# Patient Record
Sex: Female | Born: 1970 | Race: White | Hispanic: Yes | Marital: Married | Smoking: Never smoker
Health system: Southern US, Community
[De-identification: ages and names within clinical notes are randomized; demographics above are authoritative.]

## PROBLEM LIST (undated history)

## (undated) DIAGNOSIS — K2970 Gastritis, unspecified, without bleeding: Secondary | ICD-10-CM

## (undated) DIAGNOSIS — M1990 Unspecified osteoarthritis, unspecified site: Secondary | ICD-10-CM

## (undated) DIAGNOSIS — J45909 Unspecified asthma, uncomplicated: Secondary | ICD-10-CM

## (undated) DIAGNOSIS — B009 Herpesviral infection, unspecified: Principal | ICD-10-CM

## (undated) DIAGNOSIS — E785 Hyperlipidemia, unspecified: Secondary | ICD-10-CM

## (undated) DIAGNOSIS — K219 Gastro-esophageal reflux disease without esophagitis: Secondary | ICD-10-CM

## (undated) DIAGNOSIS — N189 Chronic kidney disease, unspecified: Secondary | ICD-10-CM

## (undated) DIAGNOSIS — K317 Polyp of stomach and duodenum: Secondary | ICD-10-CM

## (undated) HISTORY — DX: Hyperlipidemia, unspecified: E78.5

## (undated) HISTORY — DX: Gastritis, unspecified, without bleeding: K29.70

## (undated) HISTORY — PX: APPENDECTOMY: SHX54

## (undated) HISTORY — DX: Unspecified asthma, uncomplicated: J45.909

## (undated) HISTORY — DX: Chronic kidney disease, unspecified: N18.9

## (undated) HISTORY — DX: Polyp of stomach and duodenum: K31.7

## (undated) HISTORY — DX: Herpesviral infection, unspecified: B00.9

## (undated) HISTORY — PX: OPEN REDUCTION MALAR FRACTURE: SHX2104

## (undated) HISTORY — PX: ESOPHAGOGASTRODUODENOSCOPY: SHX1529

---

## 1999-09-17 ENCOUNTER — Encounter: Admission: RE | Admit: 1999-09-17 | Discharge: 1999-09-17 | Payer: Self-pay | Admitting: Obstetrics & Gynecology

## 1999-09-18 ENCOUNTER — Encounter: Admission: RE | Admit: 1999-09-18 | Discharge: 1999-09-18 | Payer: Self-pay | Admitting: Obstetrics & Gynecology

## 1999-09-24 ENCOUNTER — Ambulatory Visit (HOSPITAL_COMMUNITY): Admission: RE | Admit: 1999-09-24 | Discharge: 1999-09-24 | Payer: Self-pay | Admitting: Obstetrics & Gynecology

## 1999-09-26 ENCOUNTER — Encounter: Payer: Self-pay | Admitting: Obstetrics & Gynecology

## 1999-09-26 ENCOUNTER — Ambulatory Visit (HOSPITAL_COMMUNITY): Admission: RE | Admit: 1999-09-26 | Discharge: 1999-09-26 | Payer: Self-pay | Admitting: Obstetrics & Gynecology

## 1999-10-27 ENCOUNTER — Encounter: Payer: Self-pay | Admitting: Emergency Medicine

## 1999-10-27 ENCOUNTER — Emergency Department (HOSPITAL_COMMUNITY): Admission: EM | Admit: 1999-10-27 | Discharge: 1999-10-27 | Payer: Self-pay | Admitting: Emergency Medicine

## 1999-11-01 ENCOUNTER — Inpatient Hospital Stay (HOSPITAL_COMMUNITY): Admission: AD | Admit: 1999-11-01 | Discharge: 1999-11-01 | Payer: Self-pay | Admitting: *Deleted

## 1999-11-07 ENCOUNTER — Inpatient Hospital Stay (HOSPITAL_COMMUNITY): Admission: AD | Admit: 1999-11-07 | Discharge: 1999-11-07 | Payer: Self-pay | Admitting: Obstetrics & Gynecology

## 1999-12-26 ENCOUNTER — Encounter: Admission: RE | Admit: 1999-12-26 | Discharge: 1999-12-26 | Payer: Self-pay | Admitting: Obstetrics

## 2000-01-15 ENCOUNTER — Ambulatory Visit (HOSPITAL_COMMUNITY): Admission: RE | Admit: 2000-01-15 | Discharge: 2000-01-15 | Payer: Self-pay | Admitting: *Deleted

## 2000-04-29 ENCOUNTER — Inpatient Hospital Stay (HOSPITAL_COMMUNITY): Admission: AD | Admit: 2000-04-29 | Discharge: 2000-04-29 | Payer: Self-pay | Admitting: Obstetrics

## 2000-06-03 ENCOUNTER — Inpatient Hospital Stay (HOSPITAL_COMMUNITY): Admission: AD | Admit: 2000-06-03 | Discharge: 2000-06-03 | Payer: Self-pay | Admitting: Obstetrics

## 2000-06-13 ENCOUNTER — Inpatient Hospital Stay (HOSPITAL_COMMUNITY): Admission: AD | Admit: 2000-06-13 | Discharge: 2000-06-16 | Payer: Self-pay | Admitting: *Deleted

## 2000-06-13 ENCOUNTER — Encounter (INDEPENDENT_AMBULATORY_CARE_PROVIDER_SITE_OTHER): Payer: Self-pay | Admitting: Specialist

## 2000-07-08 ENCOUNTER — Encounter: Admission: RE | Admit: 2000-07-08 | Discharge: 2000-07-08 | Payer: Self-pay | Admitting: Hematology and Oncology

## 2000-08-07 ENCOUNTER — Encounter: Admission: RE | Admit: 2000-08-07 | Discharge: 2000-08-07 | Payer: Self-pay | Admitting: Hematology and Oncology

## 2000-09-09 ENCOUNTER — Inpatient Hospital Stay (HOSPITAL_COMMUNITY): Admission: EM | Admit: 2000-09-09 | Discharge: 2000-09-11 | Payer: Self-pay | Admitting: Emergency Medicine

## 2000-09-09 ENCOUNTER — Encounter: Payer: Self-pay | Admitting: Internal Medicine

## 2000-09-09 ENCOUNTER — Ambulatory Visit (HOSPITAL_COMMUNITY): Admission: RE | Admit: 2000-09-09 | Discharge: 2000-09-09 | Payer: Self-pay | Admitting: Internal Medicine

## 2000-09-09 ENCOUNTER — Encounter: Admission: RE | Admit: 2000-09-09 | Discharge: 2000-09-09 | Payer: Self-pay | Admitting: Internal Medicine

## 2000-09-10 ENCOUNTER — Encounter: Payer: Self-pay | Admitting: Internal Medicine

## 2000-09-11 ENCOUNTER — Encounter: Payer: Self-pay | Admitting: Internal Medicine

## 2000-09-16 ENCOUNTER — Encounter: Admission: RE | Admit: 2000-09-16 | Discharge: 2000-09-16 | Payer: Self-pay | Admitting: Internal Medicine

## 2001-05-07 ENCOUNTER — Ambulatory Visit (HOSPITAL_COMMUNITY): Admission: RE | Admit: 2001-05-07 | Discharge: 2001-05-07 | Payer: Self-pay | Admitting: Family Medicine

## 2001-05-07 ENCOUNTER — Encounter: Payer: Self-pay | Admitting: Family Medicine

## 2001-07-28 ENCOUNTER — Encounter: Payer: Self-pay | Admitting: Family Medicine

## 2001-07-28 ENCOUNTER — Ambulatory Visit (HOSPITAL_COMMUNITY): Admission: RE | Admit: 2001-07-28 | Discharge: 2001-07-28 | Payer: Self-pay | Admitting: Family Medicine

## 2002-12-14 ENCOUNTER — Encounter: Payer: Self-pay | Admitting: Obstetrics and Gynecology

## 2002-12-14 ENCOUNTER — Inpatient Hospital Stay (HOSPITAL_COMMUNITY): Admission: AD | Admit: 2002-12-14 | Discharge: 2002-12-14 | Payer: Self-pay | Admitting: Obstetrics and Gynecology

## 2003-01-27 ENCOUNTER — Inpatient Hospital Stay (HOSPITAL_COMMUNITY): Admission: AD | Admit: 2003-01-27 | Discharge: 2003-01-27 | Payer: Self-pay | Admitting: *Deleted

## 2003-02-22 ENCOUNTER — Ambulatory Visit (HOSPITAL_COMMUNITY): Admission: RE | Admit: 2003-02-22 | Discharge: 2003-02-22 | Payer: Self-pay | Admitting: *Deleted

## 2003-07-07 ENCOUNTER — Inpatient Hospital Stay (HOSPITAL_COMMUNITY): Admission: AD | Admit: 2003-07-07 | Discharge: 2003-07-07 | Payer: Self-pay | Admitting: Obstetrics & Gynecology

## 2003-07-12 ENCOUNTER — Inpatient Hospital Stay (HOSPITAL_COMMUNITY): Admission: AD | Admit: 2003-07-12 | Discharge: 2003-07-15 | Payer: Self-pay | Admitting: *Deleted

## 2003-07-12 ENCOUNTER — Encounter (INDEPENDENT_AMBULATORY_CARE_PROVIDER_SITE_OTHER): Payer: Self-pay | Admitting: *Deleted

## 2003-07-18 ENCOUNTER — Inpatient Hospital Stay (HOSPITAL_COMMUNITY): Admission: AD | Admit: 2003-07-18 | Discharge: 2003-07-18 | Payer: Self-pay | Admitting: *Deleted

## 2003-08-31 ENCOUNTER — Inpatient Hospital Stay (HOSPITAL_COMMUNITY): Admission: AD | Admit: 2003-08-31 | Discharge: 2003-08-31 | Payer: Self-pay | Admitting: Obstetrics and Gynecology

## 2003-09-26 ENCOUNTER — Inpatient Hospital Stay (HOSPITAL_COMMUNITY): Admission: AD | Admit: 2003-09-26 | Discharge: 2003-09-26 | Payer: Self-pay | Admitting: *Deleted

## 2004-05-22 ENCOUNTER — Ambulatory Visit: Payer: Self-pay | Admitting: Family Medicine

## 2004-05-27 ENCOUNTER — Ambulatory Visit: Payer: Self-pay | Admitting: Family Medicine

## 2004-05-27 ENCOUNTER — Ambulatory Visit: Payer: Self-pay | Admitting: *Deleted

## 2004-11-07 ENCOUNTER — Ambulatory Visit: Payer: Self-pay | Admitting: Family Medicine

## 2005-04-29 ENCOUNTER — Ambulatory Visit: Payer: Self-pay | Admitting: Family Medicine

## 2005-06-04 ENCOUNTER — Ambulatory Visit: Payer: Self-pay | Admitting: Family Medicine

## 2005-08-01 ENCOUNTER — Ambulatory Visit: Payer: Self-pay | Admitting: Family Medicine

## 2005-08-07 ENCOUNTER — Ambulatory Visit: Payer: Self-pay | Admitting: Family Medicine

## 2005-09-11 ENCOUNTER — Ambulatory Visit: Payer: Self-pay | Admitting: Family Medicine

## 2005-11-26 ENCOUNTER — Ambulatory Visit (HOSPITAL_COMMUNITY): Admission: RE | Admit: 2005-11-26 | Discharge: 2005-11-26 | Payer: Self-pay | Admitting: Internal Medicine

## 2005-11-26 ENCOUNTER — Ambulatory Visit: Payer: Self-pay | Admitting: Family Medicine

## 2005-11-28 ENCOUNTER — Ambulatory Visit: Payer: Self-pay | Admitting: Family Medicine

## 2006-04-03 ENCOUNTER — Ambulatory Visit: Payer: Self-pay | Admitting: Family Medicine

## 2006-05-25 ENCOUNTER — Ambulatory Visit: Payer: Self-pay | Admitting: Family Medicine

## 2006-05-27 ENCOUNTER — Ambulatory Visit: Payer: Self-pay | Admitting: Internal Medicine

## 2006-09-21 ENCOUNTER — Ambulatory Visit: Payer: Self-pay | Admitting: Internal Medicine

## 2006-09-24 ENCOUNTER — Encounter: Admission: RE | Admit: 2006-09-24 | Discharge: 2006-09-24 | Payer: Self-pay | Admitting: Internal Medicine

## 2006-10-05 ENCOUNTER — Ambulatory Visit: Payer: Self-pay | Admitting: Internal Medicine

## 2007-01-13 ENCOUNTER — Inpatient Hospital Stay (HOSPITAL_COMMUNITY): Admission: AD | Admit: 2007-01-13 | Discharge: 2007-01-14 | Payer: Self-pay | Admitting: Obstetrics & Gynecology

## 2007-01-19 ENCOUNTER — Ambulatory Visit: Payer: Self-pay | Admitting: Internal Medicine

## 2007-03-24 ENCOUNTER — Encounter (INDEPENDENT_AMBULATORY_CARE_PROVIDER_SITE_OTHER): Payer: Self-pay | Admitting: *Deleted

## 2007-05-19 ENCOUNTER — Ambulatory Visit: Payer: Self-pay | Admitting: Internal Medicine

## 2007-09-27 ENCOUNTER — Ambulatory Visit (HOSPITAL_COMMUNITY): Admission: RE | Admit: 2007-09-27 | Discharge: 2007-09-27 | Payer: Self-pay | Admitting: Family Medicine

## 2007-10-14 ENCOUNTER — Emergency Department (HOSPITAL_COMMUNITY): Admission: EM | Admit: 2007-10-14 | Discharge: 2007-10-14 | Payer: Self-pay | Admitting: Emergency Medicine

## 2007-10-19 ENCOUNTER — Encounter (INDEPENDENT_AMBULATORY_CARE_PROVIDER_SITE_OTHER): Payer: Self-pay | Admitting: Family Medicine

## 2007-10-19 ENCOUNTER — Ambulatory Visit: Payer: Self-pay | Admitting: Internal Medicine

## 2007-10-19 LAB — CONVERTED CEMR LAB
Cholesterol: 197 mg/dL (ref 0–200)
HDL: 46 mg/dL (ref >39)
Helicobacter Pylori Antibody-IgG: <0.4
Hemoglobin: 13.8 g/dL (ref 12.0–15.0)
LDL Cholesterol: 115 mg/dL — ABNORMAL HIGH (ref 0–99)
Lymphocytes Relative: 35 % (ref 12–46)
MCHC: 32.5 g/dL (ref 30.0–36.0)
MCV: 85.3 fL (ref 78.0–100.0)
Monocytes Absolute: 0.4 10*3/uL (ref 0.1–1.0)
Monocytes Relative: 6 % (ref 3–12)
Platelets: 248 10*3/uL (ref 150–400)
RDW: 14 % (ref 11.5–15.5)
Total CHOL/HDL Ratio: 4.3
Triglycerides: 178 mg/dL — ABNORMAL HIGH (ref <150)
VLDL: 36 mg/dL (ref 0–40)
WBC: 7.5 10*3/uL (ref 4.0–10.5)

## 2007-11-10 ENCOUNTER — Ambulatory Visit: Payer: Self-pay | Admitting: Internal Medicine

## 2007-11-10 ENCOUNTER — Encounter (INDEPENDENT_AMBULATORY_CARE_PROVIDER_SITE_OTHER): Payer: Self-pay | Admitting: Nurse Practitioner

## 2007-11-10 LAB — CONVERTED CEMR LAB: GC Probe Amp, Urine: NEGATIVE

## 2008-07-05 ENCOUNTER — Ambulatory Visit: Payer: Self-pay | Admitting: Internal Medicine

## 2008-07-06 ENCOUNTER — Encounter (INDEPENDENT_AMBULATORY_CARE_PROVIDER_SITE_OTHER): Payer: Self-pay | Admitting: Family Medicine

## 2008-07-06 LAB — CONVERTED CEMR LAB
Bilirubin Urine: NEGATIVE
Leukocytes, UA: NEGATIVE
Specific Gravity, Urine: 1.018 (ref 1.005–1.03)

## 2008-08-01 ENCOUNTER — Encounter: Payer: Self-pay | Admitting: Internal Medicine

## 2008-08-01 ENCOUNTER — Encounter (INDEPENDENT_AMBULATORY_CARE_PROVIDER_SITE_OTHER): Payer: Self-pay | Admitting: Adult Health

## 2008-08-01 ENCOUNTER — Ambulatory Visit: Payer: Self-pay | Admitting: Internal Medicine

## 2008-08-10 ENCOUNTER — Inpatient Hospital Stay (HOSPITAL_COMMUNITY): Admission: EM | Admit: 2008-08-10 | Discharge: 2008-08-11 | Payer: Self-pay | Admitting: Emergency Medicine

## 2008-08-23 ENCOUNTER — Ambulatory Visit: Payer: Self-pay | Admitting: Internal Medicine

## 2008-08-23 ENCOUNTER — Encounter (INDEPENDENT_AMBULATORY_CARE_PROVIDER_SITE_OTHER): Payer: Self-pay | Admitting: Adult Health

## 2008-08-23 LAB — CONVERTED CEMR LAB
AST: 15 units/L (ref 0–37)
Albumin: 4.4 g/dL (ref 3.5–5.2)
Alkaline Phosphatase: 75 units/L (ref 39–117)
Basophils Absolute: 0.1 10*3/uL (ref 0.0–0.1)
Basophils Relative: 1 % (ref 0–1)
Eosinophils Absolute: 0.7 10*3/uL (ref 0.0–0.7)
LDL Cholesterol: 134 mg/dL — ABNORMAL HIGH (ref 0–99)
MCHC: 30.8 g/dL (ref 30.0–36.0)
MCV: 86.5 fL (ref 78.0–100.0)
Neutrophils Relative %: 56 % (ref 43–77)
Platelets: 352 10*3/uL (ref 150–400)
Potassium: 4.2 meq/L (ref 3.5–5.3)
RDW: 13.7 % (ref 11.5–15.5)
Sodium: 139 meq/L (ref 135–145)
Total Protein: 7.6 g/dL (ref 6.0–8.3)
VLDL: 34 mg/dL (ref 0–40)
WBC: 8.1 10*3/uL (ref 4.0–10.5)

## 2008-08-29 ENCOUNTER — Ambulatory Visit: Payer: Self-pay | Admitting: Internal Medicine

## 2008-12-06 ENCOUNTER — Ambulatory Visit (HOSPITAL_COMMUNITY): Admission: RE | Admit: 2008-12-06 | Discharge: 2008-12-06 | Payer: Self-pay | Admitting: Internal Medicine

## 2009-01-15 ENCOUNTER — Encounter: Admission: RE | Admit: 2009-01-15 | Discharge: 2009-01-15 | Payer: Self-pay | Admitting: Family Medicine

## 2009-01-22 ENCOUNTER — Ambulatory Visit: Payer: Self-pay | Admitting: Internal Medicine

## 2009-03-06 ENCOUNTER — Ambulatory Visit (HOSPITAL_COMMUNITY): Admission: RE | Admit: 2009-03-06 | Discharge: 2009-03-09 | Payer: Self-pay | Admitting: Orthopedic Surgery

## 2009-03-06 ENCOUNTER — Encounter (INDEPENDENT_AMBULATORY_CARE_PROVIDER_SITE_OTHER): Payer: Self-pay | Admitting: Orthopedic Surgery

## 2009-04-06 ENCOUNTER — Ambulatory Visit: Payer: Self-pay | Admitting: Family Medicine

## 2009-05-17 ENCOUNTER — Ambulatory Visit: Payer: Self-pay | Admitting: Family Medicine

## 2009-06-15 ENCOUNTER — Ambulatory Visit: Payer: Self-pay | Admitting: Internal Medicine

## 2009-06-15 ENCOUNTER — Encounter (INDEPENDENT_AMBULATORY_CARE_PROVIDER_SITE_OTHER): Payer: Self-pay | Admitting: Adult Health

## 2009-06-15 LAB — CONVERTED CEMR LAB
Cholesterol: 256 mg/dL — ABNORMAL HIGH (ref 0–200)
LDL Cholesterol: 172 mg/dL — ABNORMAL HIGH (ref 0–99)
Triglycerides: 174 mg/dL — ABNORMAL HIGH (ref <150)

## 2009-09-14 ENCOUNTER — Ambulatory Visit: Payer: Self-pay | Admitting: Internal Medicine

## 2009-09-14 ENCOUNTER — Ambulatory Visit (HOSPITAL_COMMUNITY): Admission: RE | Admit: 2009-09-14 | Discharge: 2009-09-14 | Payer: Self-pay | Admitting: Internal Medicine

## 2009-09-14 ENCOUNTER — Encounter (INDEPENDENT_AMBULATORY_CARE_PROVIDER_SITE_OTHER): Payer: Self-pay | Admitting: Adult Health

## 2009-09-14 LAB — CONVERTED CEMR LAB
Albumin: 4.5 g/dL (ref 3.5–5.2)
Alkaline Phosphatase: 55 units/L (ref 39–117)
BUN: 11 mg/dL (ref 6–23)
Basophils Absolute: 0.1 10*3/uL (ref 0.0–0.1)
CO2: 21 meq/L (ref 19–32)
Calcium: 8.8 mg/dL (ref 8.4–10.5)
Cholesterol: 270 mg/dL — ABNORMAL HIGH (ref 0–200)
Eosinophils Relative: 9 % — ABNORMAL HIGH (ref 0–5)
Glucose, Bld: 83 mg/dL (ref 70–99)
HCT: 41.9 % (ref 36.0–46.0)
HDL: 51 mg/dL (ref >39)
Hemoglobin: 13.4 g/dL (ref 12.0–15.0)
Lymphocytes Relative: 30 % (ref 12–46)
MCHC: 32 g/dL (ref 30.0–36.0)
Monocytes Absolute: 0.3 10*3/uL (ref 0.1–1.0)
Monocytes Relative: 4 % (ref 3–12)
Potassium: 4.1 meq/L (ref 3.5–5.3)
RBC: 4.92 M/uL (ref 3.87–5.11)
RDW: 13.8 % (ref 11.5–15.5)
Sodium: 139 meq/L (ref 135–145)
Total Protein: 7.6 g/dL (ref 6.0–8.3)
Triglycerides: 172 mg/dL — ABNORMAL HIGH (ref <150)

## 2009-09-21 ENCOUNTER — Ambulatory Visit: Payer: Self-pay | Admitting: Family Medicine

## 2009-12-27 ENCOUNTER — Ambulatory Visit: Payer: Self-pay | Admitting: Internal Medicine

## 2010-01-01 ENCOUNTER — Ambulatory Visit: Payer: Self-pay | Admitting: Internal Medicine

## 2010-01-02 ENCOUNTER — Ambulatory Visit (HOSPITAL_COMMUNITY): Admission: RE | Admit: 2010-01-02 | Discharge: 2010-01-02 | Payer: Self-pay | Admitting: Internal Medicine

## 2010-01-15 ENCOUNTER — Ambulatory Visit: Payer: Self-pay | Admitting: Internal Medicine

## 2010-01-15 ENCOUNTER — Encounter (INDEPENDENT_AMBULATORY_CARE_PROVIDER_SITE_OTHER): Payer: Self-pay | Admitting: Adult Health

## 2010-01-15 LAB — CONVERTED CEMR LAB
Albumin: 4.2 g/dL (ref 3.5–5.2)
Alkaline Phosphatase: 53 units/L (ref 39–117)
Anti Nuclear Antibody(ANA): NEGATIVE
BUN: 12 mg/dL (ref 6–23)
Basophils Relative: 1 % (ref 0–1)
CO2: 18 meq/L — ABNORMAL LOW (ref 19–32)
Cholesterol: 243 mg/dL — ABNORMAL HIGH (ref 0–200)
Eosinophils Absolute: 0.3 10*3/uL (ref 0.0–0.7)
Glucose, Bld: 81 mg/dL (ref 70–99)
HCT: 41.1 % (ref 36.0–46.0)
HDL: 48 mg/dL (ref >39)
Hemoglobin: 13.2 g/dL (ref 12.0–15.0)
LDL Cholesterol: 147 mg/dL — ABNORMAL HIGH (ref 0–99)
Lymphs Abs: 1.9 10*3/uL (ref 0.7–4.0)
MCHC: 32.1 g/dL (ref 30.0–36.0)
MCV: 88 fL (ref 78.0–100.0)
Monocytes Absolute: 0.4 10*3/uL (ref 0.1–1.0)
Monocytes Relative: 5 % (ref 3–12)
RBC: 4.67 M/uL (ref 3.87–5.11)
Sodium: 137 meq/L (ref 135–145)
Total Bilirubin: 0.4 mg/dL (ref 0.3–1.2)
Total Protein: 7.2 g/dL (ref 6.0–8.3)
Triglycerides: 240 mg/dL — ABNORMAL HIGH (ref <150)
VLDL: 48 mg/dL — ABNORMAL HIGH (ref 0–40)
WBC: 8.4 10*3/uL (ref 4.0–10.5)

## 2010-05-22 ENCOUNTER — Ambulatory Visit (HOSPITAL_COMMUNITY): Admission: RE | Admit: 2010-05-22 | Discharge: 2010-05-22 | Payer: Self-pay | Admitting: Family Medicine

## 2010-09-20 ENCOUNTER — Inpatient Hospital Stay (HOSPITAL_COMMUNITY)
Admission: AD | Admit: 2010-09-20 | Discharge: 2010-09-21 | Disposition: A | Discharge status: Home / Self Care | Payer: Self-pay | Source: Ambulatory Visit | Attending: Obstetrics & Gynecology | Admitting: Obstetrics & Gynecology

## 2010-09-20 DIAGNOSIS — R079 Chest pain, unspecified: Secondary | ICD-10-CM

## 2010-10-12 LAB — CBC
HCT: 39.2 % (ref 36.0–46.0)
Hemoglobin: 13.4 g/dL (ref 12.0–15.0)
MCV: 83.7 fL (ref 78.0–100.0)
WBC: 9.5 10*3/uL (ref 4.0–10.5)

## 2010-10-22 LAB — URINALYSIS, ROUTINE W REFLEX MICROSCOPIC
Ketones, ur: NEGATIVE mg/dL
Nitrite: NEGATIVE
Protein, ur: NEGATIVE mg/dL
Urobilinogen, UA: 0.2 mg/dL (ref 0.0–1.0)
pH: 6.5 (ref 5.0–8.0)

## 2010-10-22 LAB — BASIC METABOLIC PANEL
BUN: 4 mg/dL — ABNORMAL LOW (ref 6–23)
BUN: 5 mg/dL — ABNORMAL LOW (ref 6–23)
BUN: 9 mg/dL (ref 6–23)
CO2: 20 mEq/L (ref 19–32)
CO2: 23 mEq/L (ref 19–32)
Calcium: 8.5 mg/dL (ref 8.4–10.5)
Calcium: 8.7 mg/dL (ref 8.4–10.5)
Chloride: 98 mEq/L (ref 96–112)
Creatinine, Ser: 0.62 mg/dL (ref 0.4–1.2)
GFR calc non Af Amer: >60 mL/min (ref >60)
GFR calc non Af Amer: >60 mL/min (ref >60)
GFR calc non Af Amer: >60 mL/min (ref >60)
Glucose, Bld: 111 mg/dL — ABNORMAL HIGH (ref 70–99)
Glucose, Bld: 111 mg/dL — ABNORMAL HIGH (ref 70–99)
Glucose, Bld: 111 mg/dL — ABNORMAL HIGH (ref 70–99)
Glucose, Bld: 90 mg/dL (ref 70–99)
Potassium: 3.7 mEq/L (ref 3.5–5.1)
Sodium: 126 mEq/L — ABNORMAL LOW (ref 135–145)

## 2010-10-22 LAB — DIFFERENTIAL
Basophils Absolute: 0.1 10*3/uL (ref 0.0–0.1)
Eosinophils Relative: 1 % (ref 0–5)
Lymphocytes Relative: 13 % (ref 12–46)
Neutro Abs: 9.7 10*3/uL — ABNORMAL HIGH (ref 1.7–7.7)

## 2010-10-22 LAB — URINE MICROSCOPIC-ADD ON

## 2010-10-22 LAB — CBC
HCT: 32.5 % — ABNORMAL LOW (ref 36.0–46.0)
HCT: 37.1 % (ref 36.0–46.0)
Hemoglobin: 10.9 g/dL — ABNORMAL LOW (ref 12.0–15.0)
MCHC: 33.5 g/dL (ref 30.0–36.0)
MCV: 84.4 fL (ref 78.0–100.0)
MCV: 84.8 fL (ref 78.0–100.0)
Platelets: 178 10*3/uL (ref 150–400)
Platelets: 192 10*3/uL (ref 150–400)
RDW: 12.2 % (ref 11.5–15.5)
RDW: 12.5 % (ref 11.5–15.5)
RDW: 12.6 % (ref 11.5–15.5)
WBC: 8.3 10*3/uL (ref 4.0–10.5)

## 2010-11-14 ENCOUNTER — Other Ambulatory Visit (HOSPITAL_COMMUNITY): Payer: Self-pay | Admitting: Family Medicine

## 2010-11-14 DIAGNOSIS — Z1231 Encounter for screening mammogram for malignant neoplasm of breast: Secondary | ICD-10-CM

## 2010-11-15 ENCOUNTER — Other Ambulatory Visit (HOSPITAL_COMMUNITY): Payer: Self-pay | Admitting: Family Medicine

## 2010-11-15 ENCOUNTER — Ambulatory Visit (HOSPITAL_COMMUNITY)
Admission: RE | Admit: 2010-11-15 | Discharge: 2010-11-15 | Disposition: A | Discharge status: Home / Self Care | Payer: Self-pay | Source: Ambulatory Visit | Attending: Family Medicine | Admitting: Family Medicine

## 2010-11-15 DIAGNOSIS — M2450 Contracture, unspecified joint: Secondary | ICD-10-CM

## 2010-11-15 DIAGNOSIS — R52 Pain, unspecified: Secondary | ICD-10-CM

## 2010-11-15 DIAGNOSIS — M25549 Pain in joints of unspecified hand: Secondary | ICD-10-CM | POA: Insufficient documentation

## 2010-11-19 NOTE — Op Note (Signed)
Lindsey Singleton, Lindsey Singleton                ACCOUNT NO.:  [id number]   MEDICAL RECORD NO.:  [id number]          PATIENT TYPE:  OBV   LOCATION:  5029                         FACILITY:  [facility]   PHYSICIAN:  Doralee Albino. Carola Frost, M.D. DATE OF BIRTH:  02-14-1971   DATE OF PROCEDURE:  DATE OF DISCHARGE:                               OPERATIVE REPORT   PREOPERATIVE DIAGNOSIS:  1. Left trimalleolar ankle fracture.  2. Disrupted syndesmosis.   POSTOPERATIVE DIAGNOSIS:  1. Left trimalleolar ankle fracture.  2. Disrupted syndesmosis.   PROCEDURES:  1. ORIF of left trimalleolar ankle fracture without fixation of the      posterior lip.  2. Internal fixation of left ankle syndesmosis.   SURGEON:  Dr. Myrene Galas.   ASSISTANT:  Mearl Latin, PA.   ANESTHESIA:  General.   COMPLICATIONS:  None.   TOURNIQUETS:  None.   ESTIMATED BLOOD LOSS:  65 mL.   DRAINS:  None.   DISPOSITION:  To PACU.   CONDITION:  Stable.   BRIEF SUMMARY OF INDICATIONS FOR PROCEDURE:  Lindsey Singleton is a 40-year-  old female,who slipped on the ice in her work place parking lot  sustaining an injury to the left ankle rendering her unable to bear  weight thereafter.  She was seen and evaluated in the ED and underwent a  closed reduction of a grossly dislocated fracture, and ankle joint, and  continued to have a persistent subluxation of the joint, disruption of  the syndesmosis, in again a trimalleolar pattern with severely  comminuted Weber C fracture of the fibula.  We discussed preoperatively  with her the risks and benefits of surgery including the possibility of  infection, nerve injury, vessel injury, loss of motion, arthritis,  malunion, nonunion, symptomatic hardware, need for further surgery, and  multiple others.  After full discussion she wished to proceed.   BRIEF DESCRIPTION OF PROCEDURE:  Lindsey Singleton was taken to the operating  room where general anesthesia was induced.  Her left lower extremity was  prepped and draped in usual sterile fashion.  She did receive  preoperative Ancef.  Standard lateral approach was made.  Careful  dissection carried through the subcutaneous tissues, the superficial  peroneal nerve retracted anteriorly on the fibula.  I exposed, which  again was quite comminuted, but we were able to interdigitate the  fracture, though because of the comminution, we are unable to obtain a  lag screw.  Multiple screws were placed in a 7-hole plate, both  proximally and distally, securing the fracture in a reduced position.  Some of the posterior comminution was cerclaged into place with a 0  Vicryl suture.  The medial incision was then made identifying a  significant comminution, and small fragments with sagittal fracture  plane on the medial side as well.  Because of the excessive comminution,  I was quite concerned about possibility of displacement, further  fragmentation.  Reduction was obtained provisionally with a dental pick  this had to be held by Montez Morita, the assistant, throughout.  I then  placed a K-wire followed by a partially threaded screw.  The K-wire was  bent, cut, and tamped into place.  Final AP, mortise, and lateral films  showed appropriate reduction and hardware placement without increased  comminution.  Following the copious irrigation, the syndesmosis was much  more examined and was clearly distracted and also distractible.  Consequently, a syndesmosis screw was placed above the subchondral bone  and this was placed after first holding in the reduced position with a  tenaculum, also held by the assistant, Mr. Renae Fickle.  Mr. Renae Fickle did assist  throughout the procedure with maintenance of reduction, assistance with  placement of provisional fixation, and also simultaneous wound closure.  A sterile gently compressive dressing, and then posterior and stirrup  splint was applied.  The patient was taken to the PACU in stable  condition.   PROGNOSIS:  Ms.  Singleton would be now weightbearing on her left lower  extremity for the next 6-8 weeks with graduated weightbearing  thereafter.  Given the comminution, I would anticipate protection in a  cast for 6 weeks or at least the first 5.  She will be on Lovenox.  DVT  prophylaxis while in the hospital and would most likely be discharged on  a 10-day course as well.      Doralee Albino. Carola Frost, M.D.  Electronically Signed     MHH/MEDQ  D:  08/08/2008  T:  08/09/2008  Job:  [id number]

## 2010-11-19 NOTE — Discharge Summary (Signed)
NAMEMARISE, Lindsey Singleton                ACCOUNT NO.:  [id number]   MEDICAL RECORD NO.:  [id number]          Singleton TYPE:  INP   LOCATION:  5029                         FACILITY:  [facility]   PHYSICIAN:  Doralee Albino. Carola Frost, M.D. DATE OF BIRTH:  May 03, 1971   DATE OF ADMISSION:  08/08/2008  DATE OF DISCHARGE:  08/11/2008                               DISCHARGE SUMMARY   DISCHARGE DIAGNOSES:  1. Left trimalleolar ankle fracture.  2. Disrupted left ankle syndesmosis.  3. Hyponatremia, resolved.  4. Urinary tract infection.   ADDITIONAL DISCHARGE DIAGNOSES:  1. Asthma.  2. History of pyelonephritis.   PROCEDURES PERFORMED:  1. On August 08, 2008, open reduction and internal fixation left      trimalleolar ankle fracture without fixation of Lindsey posterior lip.  2. Internal fixation of left ankle syndesmosis.   BRIEF HISTORY AND HOSPITAL COURSE:  Lindsey Singleton is a very pleasant 4-  year-old Hispanic female who sustained an injury on August 08, 2008,  while in a parking lot of her place of employment.  Lindsey Singleton stated  that she slipped on some ice which resulted in her left ankle fracture.  Lindsey Singleton was transported to [facility] where her work up was  completed which demonstrated a left trimalleolar ankle fracture and  radiograph suggestive of disrupted syndesmosis.  Given Lindsey condition of  her soft tissue, we felt it was safe to proceed with surgical  intervention on Lindsey day of injury, there was not extensive soft tissue  swelling or injury.  Lindsey Singleton was brought to Lindsey operating room on  August 08, 2008, and underwent Lindsey procedure described as above and  tolerated it very well.  After Lindsey surgery, she was transported to Lindsey  PACU for recovery from anesthesia and was then transported to Lindsey  orthopedic floor for continued observation and pain control.  On  postoperative day #1, Lindsey Singleton had significant amounts of pain still  requiring PCA, but did fairly well with  Lindsey physical therapy.  In  addition, her labs did demonstrate a sodium of 126.  However, it was  felt that this was spurious.  A repeat lab was done which showed a lab  of 132 as such her IV fluids were discontinued, and her fluids were  restricted as well.  In addition, Lindsey Singleton was started on Lovenox  therapy on postoperative day #1 for DVT prophylaxis and was encouraged  to work with therapy.  Again on admission labs, she did have a positive  UA and Cipro was started for treatment of her urinary tract infection.  On postoperative day #2, Lindsey Singleton continued to do well but continued  to have ankle pain, however, was getting better control.  She did not  have any additional complaints.  Her vital signs were stable.  She was  afebrile.  On postoperative day #3, Lindsey Singleton was deemed stable from  a pain perspective, clinically as well as from her laboratory evaluation  for discharge to home.  She worked very well with Lindsey physical therapy  throughout her entire hospital stay and did  not run into any significant  complications.   A clinical encounter note for postoperative #3 is as follows;  Subjective, objective:  Lindsey Singleton is doing much, pain is controlled  and she is ready to go home.   DISCHARGE PHYSICAL EXAMINATION:  VITAL SIGNS:  Temperature 97.2, heart  rate 94, respirations 20, BP 95/67, O2 saturation 98% on room air.  GENERAL:  Lindsey Singleton is sitting in Lindsey chair and is comfortable and  appears well.  LUNGS:  Clear to auscultation bilaterally.  CARDIAC:  S1 and S2.  ABDOMEN:  Soft, nontender with positive bowel sounds.  EXTREMITIES:  Left lower extremity; splint is clean, dry and intact,  deep peroneal nerves, superficial peroneal nerves, and tibial nerve  sensation are intact.  EHL and FHL motor functions are intact.  Lindsey  digits are warm with good color.  No pain with passive motion.   DISCHARGE LABORATORY DATA:  White blood cells 8.3, hemoglobin 11.6,   hematocrit 33.9, and platelets of 170.  Sodium 137, potassium 3.7,  chloride 106, bicarb 25, BUN 5, creatinine 0.62, glucose 90.   ASSESSMENT AND PLAN:  A 40 year old female, status post open reduction  and internal fixation, left trimalleolar ankle fracture and repair of  Lindsey syndesmosis.  1. Nonweightbearing bearing left lower extremity.  2. Deep venous thrombosis prophylaxis.  3. Lindsey Singleton worked well with Lindsey physical therapy and has been      cleared.  4. We will discharge Lindsey Singleton home today.  5. Hyponatremia has been resolved.  6. Durable medical equipment has been ordered.  7. Urinary tract infection treating with Cipro.  8. Followup in 10-14 days.   DISCHARGE MEDICATIONS:  1. Percocet 5/325 one to two p.o. q.4-6 h. as needed for pain.  2. Oxycodone 5 mg 1-2 p.o. q.3 h. for breakthrough pain in between      Percocet doses.  3. Robaxin 500 mg 1-2 p.o. q.6 h. as needed for spasms.  4. Lovenox 40 mg 1 subcutaneous injection daily x10 days.  5. Cipro 500 mg 1 p.o. b.i.d. x3 days taken until complete.   DISCHARGE INSTRUCTIONS:  Lindsey Singleton did sustain a significant injury  to her left ankle, but we were able to get excellent fixation and good  alignment to her left ankle.  Lindsey Singleton would be nonweightbearing on  her left lower extremity for Lindsey next 6-8 weeks with graduated  weightbearing thereafter.  Lindsey Singleton will return to Lindsey office in 10-  14 days for reevaluation.  At that time, we will remove her splint and  evaluate her operative wounds and obtain x-rays of her fracture to  ensure Lindsey alignment has been maintained and to evaluate her healing.  We would anticipate that given Lindsey severe comminution of Lindsey fracture we  will place Lindsey Singleton in a short leg cast after removal of Lindsey splint.  We will attempt to arrange at least 1 visit from her home health PT to  ensure that her home environment is safe and that there are no obstacles  or hazards that may  prevent Lindsey Singleton from mobilizing safely  throughout her own house.  She should use Lindsey rolling walker for  assistance with ambulation.  Lindsey Singleton has requested a wheelchair, I  am somewhat reluctant to provide this to her, but I will do so with Lindsey  caveat that she should limit its use as much as possible and should use  primarily Lindsey rolling walker.  We have also arranged  other durable  medical equipments such as 3 in 1 commode and shower chair.  Ms.  Singleton will also be on Lovenox for DVT prophylaxis for Lindsey next 10  days.  Given her young age, lack of smoking history and lack of oral  contraceptive or hormone replacement, prolonged deep vein thrombosis  prophylaxis is not indicated.  We feel that 10 days is ample treatment  for this particular injury.  Lindsey Singleton should continue with her  activities as tolerated while maintaining nonweightbearing on Lindsey left  lower extremity.  With regards to her wound care, no formal wound care  is necessary.  However, Lindsey Singleton must keep her splint clean and dry.  It is okay for her to shower so long as she uses a shower chair and  covers Lindsey splint so it does not get wet.  Should Lindsey Singleton have any  questions or concerns  prior to her visit, she is to feel free to contact Lindsey office  immediately at [phone].  Lindsey Singleton may also continue her home  medications with Lindsey exceptions that she should discontinue Lindsey use of  Lindsey meloxicam as well as Lindsey Ultram.      Mearl Latin, PA      Doralee Albino. Carola Frost, M.D.  Electronically Signed    KWP/MEDQ  D:  08/11/2008  T:  08/12/2008  Job:  [id number]

## 2010-11-19 NOTE — Discharge Summary (Signed)
NAMEJILLIANA, Lindsey Singleton                ACCOUNT NO.:  [id number]   MEDICAL RECORD NO.:  [id number]          PATIENT TYPE:  OIB   LOCATION:  5002                         FACILITY:  [facility]   PHYSICIAN:  Doralee Albino. Carola Frost, M.D. DATE OF BIRTH:  [birth year]   DATE OF ADMISSION:  03/06/2009  DATE OF DISCHARGE:  03/08/2009                               DISCHARGE SUMMARY   DISCHARGE DIAGNOSES:  1. Left ankle symptomatic hardware.  2. Intra-articular fragment left ankle joint.   ADDITIONAL DISCHARGE DIAGNOSES:  1. Asthma.  2. History of pyelonephritis.   PROCEDURES PERFORMED:  1. On March 06, 2009 removal of symptomatic hardware, left tibia and      fibula.  2. Arthrotomy with removal of intra-articular fragment left ankle.  3. Capsulectomy with manipulation of left ankle.   BRIEF HISTORY AND HOSPITAL COURSE:  Ms. Lindsey Singleton is a very pleasant 40-  year-old Hispanic female status post trimalleolar fracture dislocation  with syndesmotic disruption back in February 2010.  She did go on to  heal her fracture fairly well but continued to have persistent pain and  difficulty with ambulation.  We did diagnose a symptomatic hardware and  with the assistance of CT scan noted an intra-articular fragment that  may have been contributing to continued pain with weightbearing  activities.  As such, the patient agreed to proceed to the OR for  removal of hardware and excision of intra-articular fragment which was  done on the date described as above.  The patient did very well with  surgery without any intraoperative complications.  After brief recovery  from anesthesia in the PACU, The patient was transported to the  orthopedic floor for continued observation and pain control.  Ms.  Lindsey Singleton hospital stay was uncomplicated and no issues were noted.  It  was 2 days at length as well.  The patient did very well with physical  therapy and her pain was controlled very well with IV and p.o.  medications.  On  postop day #1, the patient did ambulate with physical  therapy, did have some difficulty but overall did fairly well.  On  postoperative day #2, the patient was deemed stable for discharge to  home with home health physical therapy as well.   DISCHARGE MEDICATIONS:  1. Norco 10/325 one to two p.o. q. 4-6 hours as needed for pain.  2. Ketorolac 10 mg one p.o. q. 6 hours x3 more days.  The patient will      take these until completed.   The patient may continue her home medications as follows:  1. Famotidine 20 mg one p.o. daily.  2. Vascor as needed for low blood pressure.   DISCHARGE INSTRUCTIONS AND PLAN:  Ms. Lindsey Singleton should do very well from  the surgery and we anticipate for recovery.  Ms. Lindsey Singleton will be  weightbearing as tolerated in her cast although this may be somewhat  difficult.  She will be allowed to put weight through that right lower  extremity.  Recommend that she does not remove her cast until her first  followup visit, which will be in 10-14  days at which time we will obtain  x-rays and remove her sutures.  We will also commence range of motion  activities of the ankle at that time.  We feel that a DVT prophylaxis is  not warranted at this time and the patient will not require a DVT  prophylaxis upon discharge.  Should the patient have any questions at  any point of time prior to a followup visit, she was encouraged to  contact the office at [phone].  We will also arrange for home health  physical therapy to assist the patient with mobilization around her  house as well upon discharge to home.  We will also continue with  ketorolac for the next 3 days as this is very effective for bone pain.  We will also begin to wean from narcotics as the patient can tolerate  this well.      Mearl Latin, PA      Doralee Albino. Carola Frost, M.D.  Electronically Signed    KWP/MEDQ  D:  03/08/2009  T:  03/08/2009  Job:  [id number]

## 2010-11-19 NOTE — Consult Note (Signed)
Lindsey Singleton, Lindsey Singleton                ACCOUNT NO.:  [id number]   MEDICAL RECORD NO.:  [id number]          PATIENT TYPE:  OBV   LOCATION:  5029                         FACILITY:  [facility]   PHYSICIAN:  Doralee Albino. Carola Frost, M.D. DATE OF BIRTH:  Dec 12, 1970   DATE OF CONSULTATION:  08/08/2008  DATE OF DISCHARGE:                                 CONSULTATION   REASON FOR CONSULTATION:  Left ankle fracture dislocation.   REQUESTING PHYSICIAN:  Devoria Albe, M.D., EDP.   CHIEF COMPLAINT:  Left ankle fracture dislocation, status post fall.   HISTORY OF PRESENT ILLNESS:  Lindsey Singleton is a very pleasant Spanish  speaking 40 year old Hispanic female who was on her way to work today in  [place] when she sustained a fall, resulting in an injury to her left  ankle.  The patient states that she was in the parking lot at her  employment when she slipped on a patch of ice, resulting in her fall and  in subsequent injury to her left ankle.  The patient had immediate onset  of pain and deformity, and was unable to bear weight at that time.  As  such, the patient was brought to [facility] via EMS for  evaluation and management.  Workup in the emergency room demonstrated a  left ankle fracture dislocation.  Lindsey Singleton noted that she had 10/10  pain immediately after the incident with primarily located about the  left ankle and without any additional associated injuries.  She denied  any loss of consciousness or any blacking out immediately before,  during, or after the event.  She is completely aware of the events  surrounding her accident and is able to provide detailed information via  her son, who is interpreting for her.  Again upon evaluation in the  emergency room, x-rays noted a left ankle fracture dislocation,  trimalleolar ankle fracture, and a posterior dislocation.  As such, the  patient was reduced by the EDP and Orthopedic consultation was obtained.  Currently, Lindsey Singleton is  accompanied by her son and her husband in ED  room 27.  She appears to be very comfortably, is currently in a short-  leg splint, and does not appear to be in any acute distress.  Currently,  her pain is 5/10, located primarily about the left ankle without any  radiation of pain.  The pain is exacerbated with movement and is  relieved with morphine and rest.  The patient denies any numbness or  tingling in her left lower extremity.  She also denies any additional  injuries elsewhere.  The patient also gets relief from ice application  as well.  The patient also denies any recent illnesses.  No chest pain  or shortness of breath.  No nausea, vomiting, or diarrhea.  Last meal  was last night, and last time she voided was this morning prior to  heading off to work.   PAST MEDICAL HISTORY:  Significant for asthma, pyelonephritis, and  questionable bouts of hypotension.   PAST SURGICAL HISTORY:  Significant for appendectomy and a C-section.   FAMILY HISTORY:  Diabetes mellitus and stroke.   DRUG ALLERGIES:  No known drug allergies.   SOCIAL HISTORY:  The patient is employed.  Does not use tobacco  products.  No alcohol use.  She lives with her husband and children.   MEDICATIONS:  Pepcid p.r.n., albuterol inhaler, Ultram p.r.n., meloxicam  p.r.n.   REVIEW OF SYSTEMS:  As noted above and is negative except for left ankle  pain.   PHYSICAL EXAMINATION:  VITAL SIGNS:  Temperature 97.4, heart rate 96,  respirations 22 at 100% on room air, BP is 107/68.  GENERAL:  The patient is a well-nourished, well-developed, Spanish  speaking female who is in mild distress, but is able to answer all  questions with the assistance of an interpreter who happens to be her  son.  HEENT:  Atraumatic.  Extraocular muscles are intact.  Moist mucous  membranes are noted.  NECK:  Supple.  No lymphadenopathy.  Active range of motion and the C-  spine is intact.  CHEST:  Clear.  HEART:  S1 and S2 are noted.   No murmurs, rubs, or gallops.  ABDOMEN:  Soft, nontender with positive bowel sounds.  Pelvis is stable  with compression at the ASIS as well as the iliac crest.  No pain on  palpation.  EXTREMITIES:  Bilateral upper extremities are unremarkable.  No gross  deformities and no acute findings.  The patient demonstrates good active  and passive range of motion at the shoulder, elbow, wrist, and hands  bilaterally.  Radial, ulnar, median, and axillary nerve sensory  functions are intact grossly bilaterally.  Radial, ulnar, median,  axillary, anterior interosseous, and posterior interosseous motor  function are also intact grossly in the bilateral upper extremities.  Palpable radial pulses are noted bilaterally.  Extremities are warm,  appropriate color with no lesions or other skin findings.  Strength is  also 5/5 bilaterally and is intact.  Soft tissue is nontender to  palpation.  Right lower extremity is unremarkable.  The patient  demonstrates good active and passive of motion of the hip, knee, and  ankle.  No tenderness to palpation over the bony landmarks of the hip,  knee, or ankle.  No hip pain with axial loading or log rolling.  The  knee does not demonstrate any ligamentous instability with testing.  Ankle does not demonstrate any ligamentous instability with  testing.  Deep peroneal nerve, superficial peroneal nerve, and tibial nerve  sensory functions are intact grossly to light touch.  EHL, FHL, anterior  tibialis, posterior tibialis, peroneals, gastroc-soleus complex,  hamstring, and quadriceps muscles are grossly intact with motor testing.  Palpable dorsalis pedis pulses noted.  The extremity is warm.  No deep  calf tenderness.  Compartments are soft and nontender.  Left lower  extremity significant for posterior and Stirrup splint to the left lower  extremity.  There are no acute findings at the left hip or left knee.  No instability or pain is elicited with palpation of the  left knee  particularly along the proximal fibula.  No instability is appreciated  either.  No pain with axial loading of the hip or with log rolling.  The  patient is also nontender with palpation of soft tissue of the thigh.  Upon careful removal over the anterior aspect of the splint and Ace wrap  dressing, soft tissue appears to be intact.  There is a small abrasion  over the anterolateral portion of the left ankle.  It is superficial in  nature  and did not involve the deeper tissue.  There is no significant  edema or ecchymosis at this point.  Deep peroneal nerve, superficial  peroneal nerve, and tibial nerve sensory functions are intact grossly to  light touch.  EHL and FHL motor function are also grossly intact.  Upon  reapplication of Ace wrap, the patient is able to demonstrate active  quad firing and firing of the hamstrings as well.  There is 2+ dorsalis  pedis pulse.  The extremity is warm.  Skin has appropriate color and  temperature.  The patient does have tenderness to palpation over the  medial and lateral malleoli as well.  Ankle motor function is limited  secondary to pain and fracture.  Again, no deep calf tenderness.  Compartments are soft and nontender.   LABORATORY DATA:  CBC:  White blood cell is 11.7, hemoglobin 12.5,  hematocrit 37.1, platelets 192.   DIAGNOSTIC STUDIES:  X-rays, prereduction of the left trimalleolar ankle  fracture with posterior tibiotalar dislocation.  Postreduction films  demonstrate successful reduction of the posterior tibiotalar dislocation  with a left trimalleolar ankle fracture and rather small posterior  malleolar fragment.   ASSESSMENT AND PLAN:  This is a 40 year old Hispanic female, status post  fall with a left trimalleolar ankle fracture dislocation, status post  reduction of the dislocation, has a Weber type C ankle fracture.  1. We will continue with splint until we are able to proceed to the OR      for open reduction and  internal fixation.  2. We do plan taking the patient to the operating room today for open      reduction and internal fixation of the left ankle fracture      dislocation given the dislocation and involvement of being a      trimalleolar fracture is inherently unstable and will require      fixation to restore stability.  I explained the risks and benefits      to the patient and she agrees to proceed with surgery.  Ms.      Fantini will be nonweightbearing now and approximately 60 weeks      after surgery given the severe nature of this injury.  Immediately      postoperatively, we will place her in a posterior and Stirrup      splint to maintain neutral position of the foot.  We anticipate      utilizing a short-leg cast after initial followup visit in the      office to help maintain position while the fracture heals.  We will      have Ms. Haywood work with physical therapy on postop day 1 to      ensure that she is able to ambulate safely with either a walker or      crutches.  Ms. Direnzo will be admitted for continued observation      and pain control postoperatively.  We would anticipate that we may      be able to discharge her to home on postop day 1 if her pain is      well controlled.  Also given the nature of this injury being lower      extremity injury and her restricted mobility, I will also place Ms.      Helms on Lovenox for DVT prophylaxis and I feel that 10-14 days      would be adequate therapy as she should mobilize pretty well once  her pain is controlled.  We will give Ancef 1 g IV on call to the      OR.  Ms. Revak will also await on call to the OR.  We will hold      off on Foley catheter placement at this time.  The patient was seen      and evaluated with Dr. Carola Frost.      Mearl Latin, PA      Doralee Albino. Carola Frost, M.D.  Electronically Signed    KWP/MEDQ  D:  08/08/2008  T:  08/09/2008  Job:  [id number]

## 2010-11-19 NOTE — Op Note (Signed)
NAMEMILAYA, HORA                ACCOUNT NO.:  [id number]   MEDICAL RECORD NO.:  [id number]          PATIENT TYPE:  OIB   LOCATION:  5002                         FACILITY:  [facility]   PHYSICIAN:  Doralee Albino. Carola Frost, M.D. DATE OF BIRTH:  [birth year]   DATE OF PROCEDURE:  03/06/2009  DATE OF DISCHARGE:                               OPERATIVE REPORT   PREOPERATIVE DIAGNOSES:  1. Left ankle symptomatic hardware.  2. Intra-articular fragment, left ankle joint.   POSTOPERATIVE DIAGNOSES:  1. Left ankle symptomatic hardware.  2. Intra-articular fragment, left ankle joint.   PROCEDURES:  1. Removal of symptomatic hardware, tibia, and fibula.  2. Arthrotomy with exploration and removal of intra-articular      fragment.  3. Capsulectomy and manipulation of left ankle.   SURGEON:  Doralee Albino. Carola Frost, MD   ASSISTANT:  Mearl Latin, PA   ANESTHESIA:  General.   SPECIMEN:  Intra-articular fragments.   Disposition to pathology.   ESTIMATED BLOOD LOSS:  Minimal.   COMPLICATIONS:  None.   TOTAL TOURNIQUET TIME:  Approximately 40 minutes.   DISPOSITION:  PACU.   CONDITION:  Stable.   BRIEF SUMMARY/INDICATIONS FOR PROCEDURE:  Ms. Newport is a 40 year old  female status post a trimalleolar fracture dislocation, syndesmotic  disruption.  She appears to have gone on to unite, but has had  persistent pain about the ankle joint particularly over the hardware.  CAT scan demonstrated an intra-articular body in the posterolateral  aspect of the joint.  This was also associated with restricted range of  motion in extension.  I discussed with her preoperatively the risk and  benefits of surgery including the possibility of failure to improve  symptoms, need for further surgery, the possibility of occult nonunion,  DVT, and multiple others.  After full discussion, wished to proceed.   Ms. Nobile was administered preop antibiotics, taken to operating room  where general anesthesia was induced.   Her left lower extremity was  prepped and draped in usual sterile fashion.  Tourniquet was placed  about the thigh.  After elevation of the extremity and exsanguination  with an Esmarch bandage, tourniquet was inflated to 300 mmHg.  The old  lateral incision was remade, dissection carried down to the hardware.  The nerve retracted anteriorly.  The lateral hardware removed.  The  peroneal tendons were mobilized and dissection continued posterior to  them around the back of the ankle.  The deep fascia was divided deep  perforating vein was ligated with electrocautery and divided, this then  exposed the posterior capsule of the ankle joint.  I initially just  performed an arthrotomy longitudinally going down and extending the  ankle as much as possible to try to enter the tibiotalar joint and  remove the fragment.  However, because of restricted motion, we went  ahead and performed a complete capsulectomy across the posterior aspect  of the joint in order to mobilize the joint.  This was followed by  manipulation in extension.   After this release, I was then able to use the pickups to identify an  area of  scar tissue that was just at the edge or in the posterior aspect  of the articular surface.  Using the [place], the pickups, and  electrocautery, I was able to remove it without injury to the chondral  surface.  Examination of the contents did reveal the chondral fragment  or osteochondral fragment that had been within the joint.  This was sent  to pathology for gross identification.  The joint itself was irrigated  thoroughly as was the subtalar joint cartilage surfaces on both joints  appeared to be very healthy to gross inspection where it was visualized.  The tourniquet was then deflated and pressure was held.  Hemostasis was  obtained further with electrocautery and then a layered closure  performed with 2-0 Vicryl and 3-0 nylon.  Sterile gently compressive  dressing was applied and  then a splint.  The patient was awakened from  anesthesia and transported to PACU in stable condition.  Montez Morita, PA-  C assisted me throughout the procedures and was absolutely necessary for  their safe and effective completion as it was very difficult to see the  posterolateral aspect of the ankle and retraction was necessary to  protect the neurovascular bundle as well as the tendons.  Also assisted  with mobilization of the joint during extraction of the fragment in  further assisting me with simultaneous wound closure as well as position  for the hardware removal.   PROGNOSIS:  Ms. Chumley will be partial weightbearing as tolerated in a  CAM boot.  We plan to remove her sutures in 10-14 days.  She will  continue to work on active and passive range of motion of the ankle.  She will be on DVT prophylaxis with Lovenox while in the hospital, but  would not anticipate the need for that after discharge.      Doralee Albino. Carola Frost, M.D.  Electronically Signed     MHH/MEDQ  D:  03/06/2009  T:  03/07/2009  Job:  [id number]

## 2010-11-22 NOTE — Discharge Summary (Signed)
[place]  Patient:    Lindsey Singleton, Lindsey Singleton                         MRN: [id number] Adm. Date:  [id number] Disc. Date: [id number] Attending:  Farley Ly Dictator:   Lyanne Co, M.D.                           Discharge Summary  DISCHARGE DIAGNOSIS:  Febrile illness, unclear etiology.  DISCHARGE MEDICATIONS: 1. Augmentin 875 mg p.o. b.i.d. to complete a seven-day course. 2. Zithromax 500 mg p.o. q.d. to complete a five-day course.  FOLLOW-UP APPOINTMENT:  Lindsey Singleton was instructed to follow up in the clinic in the interim resident outpatient clinic on March 13th at 1:45 P.M.  PROCEDURE:  Ultrasound of the pelvis done on September 10, 2000, which was normal.  CONSULTATIONS:  None.  BRIEF HISTORY AND PHYSICAL:  This is a 40 year old Hispanic female (non-English speaking) with no significant past medical history who presents with a one-day history of body aches, headache, painful joints and fevers. Ms. Top reports that she began feeling badly the night prior to admission.  She states that the headaches she experiences are not new for her, but that she had a headache of approximately 8/10 earlier on the day of admission, and by the time of interview is approximately 1-2/10.  Her other symptoms including myalgias and arthralgias are new and associated with emesis times two the day of admission.  She reports a good meal the day prior to admission; and, the evening of admission in the ER was able to keep food down.  She does report mild abdominal abdominal suprapubic pain.  She denies sick contacts and states her neck felt "crooked earlier," but denies stiffness. She denies travel out of the country recently.  She denies cough and congestion, but does report a sore throat.  REVIEW OF SYSTEMS:  Negative for double or blurry vision, hearing changes, shortness of breath, vaginal discharge or bleeding, dysuria, and melena. [name] does report  some sternal pressure and pain that is associated with occasional nausea, no radiation, no diaphoresis, no shortness of breath, and tends to be associated with stress, and is not associated with exertion. She also describes bright red blood per rectum.  PAST MEDICAL HISTORY:  None.  ALLERGIES:  No known drug allergies.  MEDICATIONS:  Tylenol and prenatal vitamins.  PAST SURGICAL HISTORY:  C-section nine years ago.  Appendectomy five years ago.  FAMILY MEDICAL HISTORY:  Dad is alive with an unknown health status.  Mom died at 29 years of age from an embolism.  SOCIAL HISTORY:  Lindsey Singleton lives in [place], [place].  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  She is married and denies tobacco, alcohol or drug use.  PHYSICAL EXAMINATION:  VITAL SIGNS ON ADMISSION:  Temp 101.8, blood pressure 84/46, pulse 109, respiratory rate 18, and saturation 98% on room air.  Blood pressure lying is 100/60 with a pulse of 100; sitting 79/47 with pulse of 109.  IN GENERAL:  This is a Hispanic female in no acute distress.  HEENT:  Pupils are equally round and react to light.  Tympanic membranes are clear bilaterally.  Oropharynx is clear without erythema or exudate.  NECK:  No stiffness or pain noted with neck flexion noted.  CARDIOVASCULAR:  Regular rate and rhythm.  RESPIRATORY:  Clear to auscultation bilaterally.  ABDOMEN:  Bowel sounds present.  Soft, nondistended and nontender.  EXTREMITIES:  No edema in the lower extremities bilaterally.  NEUROLOGIC:  Nonfocal.  Moves all extremities well.  LABORATORIES:  (Drawn in clinic.)  CBC:  white count 8.3, hemoglobin 12.7 and platelets 229,000.  ANC 7.1.  BMP:  sodium 135, potassium 3.5, chloride 102, bicarb 26, BUN 13, creatinine 0.7, glucose 88, and calcium 9.3.  Rapid Strep is negative.  Chest x-ray revealed evidence of bronchitis and no infiltrates.  UA is negative.  Mono spot negative.  HOSPITAL COURSE BY PROBLEM: #1 -  ORTHOSTATIC HYPOTENSION:  Lindsey Singleton was noted to be orthostatic prior to admission with a 13 point drop in her diastolic blood pressure from lying to sitting as well as an increase in her pulse of nine points.  Lindsey Singleton was hydrated with IV fluids and on the morning following admission was orthostatic by pulse only.  She was continued on hydration and was also advanced to a regular diet with good p.o. intake; and, her orthostasis improved prior to discharge.  #2 - FEBRILE ILLNESS:  Lindsey Singleton was noted to be feverish to a T max of 102.1.  The source of this fever was unclear and differential diagnosis included influenza, PID, pneumonia, UTI, tubo-ovarian abscess, and _____ abscess.  Lindsey Singleton was noted to have a negative rapid Strep, a negative gonorrhea and Chlamydia screen, negative blood cultures drawn in the clinic, negative urinalysis, negative chest x-ray, and negative pelvic ultrasound. She was covered initially empirically with treatment for PID as she did have some cervical motion tenderness on exam.  She was given a shot of IM Rocephin in the clinic and covered with azithromycin.  This coverage with azithromycin was continued while in the hospital and Augmentin was also added empirically for coverage.  The only positive finding on labs was a mild elevation in the liver enzymes with an AST of 45 and an ALT of 57.  This may be secondary simply to a viral illness.  Nonetheless, an acute hepatitis panel was added on to labs prior to discharge and is still pending.  This will need to be followed up as an outpatient.  Therefore, Lindsey Singleton was discharged with etiology still unclear with regards to this febrile illness.  However, at the time of discharge she had been afebrile for 24 hours, her orthostasis had resolved, she was tolerating p.o.s well and ambulating well.  She has a 16-year-old child at home  and was anxious to be at home with him.  She seemed vastly  improved and was discharged  on empiric Augmentin and Zithromax with strict instructions to continue taking liquids and to complete a full course of antibiotics.  #3 - ANEMIA:  Ms, Singleton did report a history of bright red blood per rectum.  Iron and ferratin levels were drawn during her hospital admission and noted to be ferratin of 69 and iron of 47; both within normal limits.  Her hemoglobin briefly dipped down to become anemic, but  did resolve prior to discharge and was normal on the day of discharge at 12.3.  This may need further confirmation and follow up as an outpatient.  The bright red blood per rectum is likely secondary to hemorrhoids, especially concerning Ms. Jennings is status post delivery approximately three months ago; and, then can be followed as an outpatient.  #4 - LEUKOPENIA:  Ms. Moman was noted to have a suppressed white cell count with 3.7 on the day of discharge.  This is  likely, again, secondary to a viral illness, but will need to be followed with a confirmed return of white cells to normal as an outpatient.  If this does not occur further workup will need to be undertaken.  #5 - CHEST PAIN:  Ms. Saur did report some occasional chest pain on her review of systems on admission.  It is unlikely that this chest pain is cardiac in origin in this young woman with no hypertension, no diabetes, no tobacco, no significant family medical history, and unknown lipid status. Ms. Capito herself reports that this almost always associated with stress and it is likely this is more GI versus anxiety in origin.  If it is persistent or if other symptoms arise that are concerning for anginal chest pain it will need to be evaluated further.  #6 - DISCHARGE LABS:  CBC: white blood cells 3.7, hemoglobin 12.3, hematocrit 35.5 and platelets 194,000.  BMP:  sodium 136, potassium 3.4, chloride 107, bicarb 24, BUN 9, creatinine 0.6, and glucose 88.  AST 45, ALT 57,  alkaline phosphatase 77, total protein 6.5, and  albumin 3.3.  DISPOSITION:  Ms. Riga was discharged to home in good condition.  Resident:  Dr. Lyanne Co. DD:  09/13/00 TD:  09/15/00 Job: [id number]

## 2010-11-22 NOTE — Op Note (Signed)
NAME:  Lindsey Singleton, Lindsey Singleton                          ACCOUNT NO.:  [id number]   MEDICAL RECORD NO.:  [id number]                   PATIENT TYPE:  INP   LOCATION:  9199                                 [facility]ILITY:  [facility]   PHYSICIAN:  Phil D. Okey Dupre, M.D.                  DATE OF BIRTH:  1970-10-31   DATE OF PROCEDURE:  07/12/2003  DATE OF DISCHARGE:                                 OPERATIVE REPORT   PROCEDURE:  Low transverse cesarean section.   PREOPERATIVE DIAGNOSIS:  Abruptio placenta with fetal jeopardy.   POSTOPERATIVE DIAGNOSIS:  Abruptio placenta with fetal jeopardy.   SURGEON:  Javier Glazier. Okey Dupre, M.D.   FIRST ASSISTANT:  Dr. __________   Bronwen Betters BLOOD LOSS:  700 cc.   OPERATIVE FINDINGS:  Upon entering the uterine cavity, a large blood clot  and brandy wine colored fluid extruded from the cavity immediately, and  following delivery of the baby, the placenta was expelled quickly and sent  for pathological diagnosis.   PROCEDURE:  Under satisfactory epidural anesthesia, with the patient in the  dorsal supine position, Foley catheter in the urinary bladder, the abdomen  was prepped and draped in the usual sterile manner and entered through a  midline incision extending the patient just below the umbilicus and just  above the symphysis pubis.  The abdomen was entered by layers, entering the  peritoneal cavity.  The visceral peritoneum and the anterior surface of the  uterus opened transversely by sharp dissection, the bladder pushed away.  The lower uterine segment was entered by sharp and blunt dissection and from  a low T presentation, the baby was easily delivered, cord doubly clamped  and divided and the baby handed to the pediatrician.  The placenta  spontaneously was removed immediately after delivery of the baby.  Uterus  explored and closed with continuous running locked 0 Vicryl on an atraumatic  needle.  There were several bleeding points along the suture lines that were  reinforced with a second layer of 0 Vicryl on an atraumatic needle.  There  was still some bleeding in the area of the right corner of the uterine  incision, which a figure-of-eight suture was used to control.  The area was  once again observed.  No bleeding was noted.  The fascia was closed with a  continuous running 0 PDS loop suture.  Subcutaneous approximation was  carried out with 3-0 Vicryl continuous suture and skin edge approximated  with skin staples.  Dry sterile dressing was applied.  The patient tolerated  the procedure well.  Approximately 700 cc blood loss, and was transferred to  the recovery room in satisfactory condition.  Phil D. Okey Dupre, M.D.    PDR/MEDQ  D:  07/12/2003  T:  07/12/2003  Job:  [id number]

## 2010-11-22 NOTE — Discharge Summary (Signed)
NAME:  Lindsey Singleton, Lindsey Singleton                          ACCOUNT NO.:  [id number]   MEDICAL RECORD NO.:  [id number]                   PATIENT TYPE:  INP   LOCATION:  9101                                 [facility]  [facility]   PHYSICIAN:  Conni Elliot, M.D.             DATE OF BIRTH:  1970/11/15   DATE OF ADMISSION:  07/12/2003  DATE OF DISCHARGE:  07/15/2003                                 DISCHARGE SUMMARY   DISCHARGE DIAGNOSES:  1. Intrauterine pregnancy, delivered at 38 weeks.  2. Status post low transverse cesarean section.  3. Postpartum anemia, hemoglobin 7.9 on discharge.   DISCHARGE MEDICATIONS:  1. Ibuprofen 800 mg pill, one pill eight hours as needed for pain.  2. Percocet 5/325 mg one to two pills every four hours as needed for pain.  3. Micronor one pill daily.  4. Iron 325 mg one pill twice daily for one month.   FOLLOW-UP CARE:  The patient is to return in two days to the maternity  admissions unit to take out staples and in six weeks to [facility] for  postpartum care.   HISTORY OF PRESENT ILLNESS:  This is a 40 year old G3, P2-0-0-2, presenting  at 93 and 3 by an eight-week ultrasound, who was complaining of  contractions, no loss of fluid, some vaginal bleeding, still feels the baby  move.   ALLERGIES:  CEPHALOSPORINS, patient has rash.   MEDICATIONS:  None.   OBSTETRICAL HISTORY:  The patient in 1992 had  cesarean section in [place]  for failure to dilate and in 2001 she had a term successful VBAC.   PAST MEDICAL HISTORY:  History of rheumatic fever as a child.  Atypical Pap  on Pap smear noted during pregnancy.   PRENATAL LABORATORY DATA:  The patient was O positive, antibody negative.  Hemoglobin 13.3, platelets 239.  Hepatitis B antigen negative.  Syphilis  negative.  HIV negative.  GC, Chlamydia, and GBS all negative.   ADMISSION PHYSICAL EXAMINATION:  VITAL SIGNS:  Temperature 97.4, pulse 70,  respirations 22, blood pressure 109/64.  GENERAL:  The patient  was in no acute distress.  PELVIC:  Digital cervical exam was 3, 40% effaced, and -1.  MONITORING:  Fetal heart tones baseline 150, positive reactive, positive  variability, occasional decelerations.  She was contracting every two to  three minutes.   The patient was admitted with epidural anesthesia for a trial of labor.  The  patient while in labor did have some fetal decelerations and increased  vaginal bleeding, so the patient was brought back to the OR on July 12, 2003, for fetal intolerance of labor, question abruption.  She had a low  transverse cesarean section performed by Dr. Okey Dupre and with an estimated  blood loss of 1000 mL.  For full dictation please see dictation of that  date.  The patient was delivered of a viable female infant, Apgars 9 and 9.  The patient had a routine postoperative course, although did have a fair  amount of intraoperative blood loss and was therefore placed on iron one  tablet p.o. b.i.d.  Of note, the patient did have a vertical skin incision  following her previous skin incision; therefore, staples were left in for an  extra two days.     Bradly Bienenstock, M.D.                         Conni Elliot, M.D.    Arliss Journey  D:  09/07/2003  T:  09/07/2003  Job:  [id number]

## 2010-12-03 ENCOUNTER — Ambulatory Visit (HOSPITAL_COMMUNITY)
Admission: RE | Admit: 2010-12-03 | Discharge: 2010-12-03 | Disposition: A | Discharge status: Home / Self Care | Payer: Self-pay | Source: Ambulatory Visit | Attending: Family Medicine | Admitting: Family Medicine

## 2010-12-03 DIAGNOSIS — Z1231 Encounter for screening mammogram for malignant neoplasm of breast: Secondary | ICD-10-CM | POA: Insufficient documentation

## 2011-04-01 LAB — URINALYSIS, ROUTINE W REFLEX MICROSCOPIC
Bilirubin Urine: NEGATIVE
Nitrite: NEGATIVE
Protein, ur: 30 — AB
Specific Gravity, Urine: 1.023
Urobilinogen, UA: 1

## 2011-04-01 LAB — CBC
HCT: 39.2
Hemoglobin: 13.1
MCHC: 33.5
MCV: 81.9
RBC: 4.78
RDW: 13.2

## 2011-04-01 LAB — DIFFERENTIAL
Eosinophils Absolute: 0.1
Lymphocytes Relative: 7 — ABNORMAL LOW
Lymphs Abs: 0.7
Monocytes Relative: 2 — ABNORMAL LOW
Neutro Abs: 9.2 — ABNORMAL HIGH
Neutrophils Relative %: 90 — ABNORMAL HIGH

## 2011-04-01 LAB — COMPREHENSIVE METABOLIC PANEL
BUN: 12
CO2: 23
Calcium: 8.7
Creatinine, Ser: 0.58
GFR calc non Af Amer: >60
Glucose, Bld: 111 — ABNORMAL HIGH
Sodium: 138
Total Protein: 6.8

## 2011-04-01 LAB — URINE MICROSCOPIC-ADD ON

## 2011-04-01 LAB — LIPASE, BLOOD: Lipase: 15

## 2011-04-01 LAB — PREGNANCY, URINE: Preg Test, Ur: NEGATIVE

## 2011-04-22 LAB — CBC
HCT: 40.5
Hemoglobin: 13.6
MCHC: 33.7
MCV: 82.9
RBC: 4.88

## 2011-04-22 LAB — URINALYSIS, ROUTINE W REFLEX MICROSCOPIC
Nitrite: NEGATIVE
Protein, ur: NEGATIVE
Specific Gravity, Urine: 1.01
Urobilinogen, UA: 0.2

## 2011-04-22 LAB — COMPREHENSIVE METABOLIC PANEL
ALT: 39 — ABNORMAL HIGH
CO2: 28
Calcium: 9.4
Creatinine, Ser: 0.63
GFR calc non Af Amer: >60
Glucose, Bld: 109 — ABNORMAL HIGH

## 2011-04-22 LAB — POCT PREGNANCY, URINE
Operator id: 114931
Preg Test, Ur: NEGATIVE

## 2011-04-22 LAB — URINE MICROSCOPIC-ADD ON

## 2011-06-09 ENCOUNTER — Emergency Department (HOSPITAL_COMMUNITY)
Admission: EM | Admit: 2011-06-09 | Discharge: 2011-06-09 | Disposition: A | Discharge status: Home / Self Care | Payer: Self-pay | Source: Home / Self Care | Attending: Family Medicine | Admitting: Family Medicine

## 2011-06-09 DIAGNOSIS — N3000 Acute cystitis without hematuria: Secondary | ICD-10-CM

## 2011-06-09 LAB — POCT URINALYSIS DIP (DEVICE)
Glucose, UA: NEGATIVE mg/dL
Ketones, ur: NEGATIVE mg/dL
Specific Gravity, Urine: >=1.03 (ref 1.005–1.030)
Urobilinogen, UA: 0.2 mg/dL (ref 0.0–1.0)

## 2011-06-09 LAB — POCT PREGNANCY, URINE: Preg Test, Ur: NEGATIVE

## 2011-06-09 MED ORDER — PHENAZOPYRIDINE HCL 200 MG PO TABS: 200.0000 mg | ORAL_TABLET | Freq: Three times a day (TID) | ORAL | Status: AC

## 2011-06-09 MED ORDER — CEPHALEXIN 500 MG PO CAPS: 500.0000 mg | ORAL_CAPSULE | Freq: Two times a day (BID) | ORAL | Status: AC

## 2011-06-09 NOTE — ED Provider Notes (Signed)
Medical screening examination/treatment/procedure(s) were performed by non-physician practitioner and as supervising physician I was immediately available for consultation/collaboration.  Hillery Hunter, MD 06/09/11 [phone]

## 2011-06-09 NOTE — ED Provider Notes (Signed)
History     CSN: [id number] Arrival date & time: 06/09/2011  4:51 PM   First MD Initiated Contact with Patient 06/09/11 1714      Chief Complaint  Patient presents with  . Dysuria    (Consider location/radiation/quality/duration/timing/severity/associated sxs/prior treatment) Patient is a 40 y.o. female presenting with dysuria. The history is provided by the patient.  Dysuria  This is a new problem. The current episode started 12 to 24 hours ago. The problem occurs every urination. The problem has not changed since onset.The quality of the pain is described as burning. The pain is mild. There has been no fever. Associated symptoms include frequency and urgency. Pertinent negatives include no chills, no nausea, no vomiting, no discharge, no hesitancy, no possible pregnancy and no flank pain. She has tried nothing for the symptoms. Her past medical history does not include recurrent UTIs.    History reviewed. No pertinent past medical history.  Past Surgical History  Procedure Date  . Open reduction malar fracture   . Appendectomy   . Cesarean section     History reviewed. No pertinent family history.  History  Substance Use Topics  . Smoking status: Never Smoker   . Smokeless tobacco: Not on file  . Alcohol Use: No    OB History    Grav Para Term Preterm Abortions TAB SAB Ect Mult Living                  Review of Systems  Constitutional: Negative for fever and chills.  Gastrointestinal: Positive for abdominal pain. Negative for nausea and vomiting.  Genitourinary: Positive for dysuria, urgency and frequency. Negative for hesitancy and flank pain.    Allergies  Review of patient's allergies indicates no known allergies.  Home Medications   Current Outpatient Rx  Name Route Sig Dispense Refill  . CEPHALEXIN 500 MG PO CAPS Oral Take 1 capsule (500 mg total) by mouth 2 (two) times daily. 14 capsule 0  . PHENAZOPYRIDINE HCL 200 MG PO TABS Oral Take 1 tablet (200 mg  total) by mouth 3 (three) times daily. 6 tablet 0    BP 106/65  Pulse 76  Temp(Src) 97.6 F (36.4 C) (Oral)  Resp 18  SpO2 98%  Physical Exam  Nursing note and vitals reviewed. Constitutional: She appears well-developed and well-nourished. No distress.  Cardiovascular: Normal rate, regular rhythm and normal heart sounds.   Pulmonary/Chest: Effort normal and breath sounds normal. No respiratory distress.  Abdominal: Soft. Bowel sounds are normal. She exhibits no distension and no mass. There is tenderness (mild suprapubic TTP). There is no rebound.  Neurological: She is alert.  Skin: Skin is warm and dry.  Psychiatric: She has a normal mood and affect.    ED Course  Procedures (including critical care time)  Labs Reviewed  POCT URINALYSIS DIP (DEVICE) - Abnormal; Notable for the following:    Hgb urine dipstick LARGE (*)    Protein, ur >=300 (*)    Leukocytes, UA MODERATE (*) Biochemical Testing Only. Please order routine urinalysis from main lab if confirmatory testing is needed.   All other components within normal limits  POCT PREGNANCY, URINE  POCT PREGNANCY, URINE  POCT URINALYSIS DIPSTICK   No results found.   1. Cystitis, acute       MDM  UA pos        Melody Comas, [place] 06/09/11 1755

## 2011-06-09 NOTE — ED Notes (Signed)
C/o pain w urination , started today; limited [place]; [employer] available for providers use

## 2011-12-03 ENCOUNTER — Encounter: Payer: Self-pay | Admitting: *Deleted

## 2011-12-25 ENCOUNTER — Encounter: Payer: Self-pay | Admitting: Advanced Practice Midwife

## 2012-01-22 ENCOUNTER — Encounter: Payer: Self-pay | Admitting: Obstetrics & Gynecology

## 2012-02-11 ENCOUNTER — Ambulatory Visit (INDEPENDENT_AMBULATORY_CARE_PROVIDER_SITE_OTHER): Payer: Self-pay | Admitting: Obstetrics & Gynecology

## 2012-02-11 ENCOUNTER — Encounter: Payer: Self-pay | Admitting: Obstetrics & Gynecology

## 2012-02-11 VITALS — BP 116/68 | HR 87 | Temp 97.9°F | Resp 20 | Ht 61.0 in | Wt 171.1 lb

## 2012-02-11 DIAGNOSIS — IMO0002 Reserved for concepts with insufficient information to code with codable children: Secondary | ICD-10-CM

## 2012-02-11 DIAGNOSIS — N814 Uterovaginal prolapse, unspecified: Secondary | ICD-10-CM

## 2012-02-11 DIAGNOSIS — N816 Rectocele: Secondary | ICD-10-CM

## 2012-02-11 DIAGNOSIS — N8111 Cystocele, midline: Secondary | ICD-10-CM

## 2012-02-11 NOTE — Progress Notes (Signed)
Pt states she is unsure why she was referred to this clinic. She does report that she has had "balls" inside her vagina which come and go for 12 years. They have been occurring more frequently over the past 6 months. These cause her pain especially during intercourse.   Pt c/o bulge with pain at introitus.  S/p 3 very difficult deliveries.  Had a c/s which was preceeded by hours of pushing.  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  Pt will read the literature and f/u in 4-6 weeks  Carolyn L. Harraway-Smith, M.D., [place]

## 2012-02-11 NOTE — Patient Instructions (Addendum)
Prolapso  (Prolapse) Prolapso es un trmino que se refiere al desprendimiento, abultamiento, o cada de una parte del cuerpo. Los rganos que con ms frecuencia sufren prolapso son el recto, el intestino delgado, la vejiga, la uretra, la vagina (canal de parto), el tero y el cuello del tero. Un prolapso ocurre cuando los ligamentos y el tejido muscular que rodea al recto, la vejiga y el tero se daan o se debilitan. CAUSAS Ocurre especialmente con  [place]. Algunas mujeres sienten presin plvica o tienen problemas para contener la orina luego del [place], debido al estiramiento y Insurance account manager de los tejidos plvicos. Esto generalmente mejora con el tiempo y la sensacin a menudo desaparece, Leisure centre manager.   Levantar pesos excesivos de [place] habitual.   La edad.   En la menopausia, con la disminucin de la produccin de estrgenos, se debilitan los ligamentos y los msculos de la pelvis.   Cirugas anteriores de la pelvis.   Obesidad.   Constipacin crnica.   Tos crnica.  Un prolapso puede afectar un nico rgano, o bien varios rganos pueden sufrir un prolapso al [employer]. La pared anterior de la vagina sostiene la vejiga. La pared posterior sostiene parte del recto. El tero ocupa un Producer, television/film/video. Todos estos rganos pueden verse involucrados cuando los ligamentos y los msculos que rodean la vagina se Gaffer. Esto a menudo empeora cuando la mujer deja de producir estrgeno (menopausia). SNTOMAS  No poder controlar la salida de orina (incontinencia) al toser, Engineering geologist, Education officer, environmental esfuerzos y ejercicio.   Necesitar realizar mucha fuerza en un movimiento intestinal debido a que la materia fecal est retenida.   Cuando una parte de un rgano sobresale a travs de la apertura de la vagina, a veces existe una sensacin de pesadez o presin. Puede sentirse como que algo se est cayendo. Esta sensacin aumenta al toser o [employer].   Si el  rgano sobresale por la apertura de la vagina y roza contra la ropa, puede producirse irritacin, lceras, infeccin, dolor y Landscape architect.   Dolor en la cintura.   Presin en la zona superior o inferior de la vagina, prdida de [place] o de materia fecal.   Problemas durante las [employer].   Imposibilidad de insertar un tampn o el aplicador.  DIAGNSTICO A menudo un examen fsico es todo lo que se necesita para identificar el problema. Durante el examen se le pedir que tosa y [employer] msculos mientras est [place], sentada y parada. El Nurse, children's si se deben realizar ms exmenes, como por ejemplo, para verificar del funcionamiento de su vejiga. Algunos diagnsticos son:  Cistocele: abultamiento y cada de la vejiga dentro de la parte superior de la vagina.   Rectocele: parte del recto se abulta hacia dentro de la vagina.   Prolapso del tero: el tero cae hacia la vagina.   Enterocele Abultamiento en la zona superior de la vagina, luego de [place]ista (remocin del tero), en el que el intestino delgado protruye en la vagina. Hernia en la parte superior de la vagina.   Uretrocele: La uretra (conducto por el que pasa la orina) protruye en la zona de la vagina.  TRATAMIENTO En la mayor parte de los casos, slo se debe tratar un prolapso si produce sntomas. Si los sntomas interfieren en sus actividades diarias o en las relaciones sexuales, ser necesario Pensions consultant. A continuacin se indican algunas medidas que puede utilizar para tratar el prolapso.  El estrgeno puede  ayudar a las [employer] con prolapsos leves.   Los ejercicios de [place]egel pueden ser tiles para solucionar casos leves de prolapso al fortalecer y tensar los msculos del piso plvico.   Se utilizan pesarios en mujeres que no pueden o eligen no realizarse [place]. Un pesario es una pieza de plstico o goma con forma de rosca que se coloca en la vagina para Pharmacologist a  los Programmer, multimedia. El pesario debe ser colocado por el profesional, quien adems le dar las instrucciones pertinentes relacionadas con el dispositivo. Si funciona bien en usted, puede ser el nico tratamiento que se necesite.   A menudo la ciruga es la nica forma de tratamiento para prolapsos ms graves. Existen distintos tipos de [place] disponibles, y deber Lobbyist con el profesional acerca de cul es el mejor para usted. Si el tero ha sufrido un prolapso, es posible que sea removido (histerectoma) como parte del procedimiento quirrgico. El profesional Art gallery manager acerca de los riesgos y beneficios.   Podr realizarse una suspensin utero-vaginal (ciruga para sostener los rganos), especialmente si quiere cuidar su fertilidad.  Ninguna forma de tratamiento garantiza la correccin del prolapso o el alivio de los sntomas. INSTRUCCIONES PARA EL CUIDADO DOMICILIARIO:  Utilice una almohadilla sanitaria o algn producto absorbente si tiene incontinencia urinaria.   Evite levantar pesos y los deportes o trabajos extenuantes.   Tome analgsicos de venta libre para las molestias leves.   Tome estrgenos o utilcelos en forma de crema vaginal.   Haga los ejercicios de Kegel o utilice un pesario antes de decidirse por la [place].   Haga los ejercicios de Kegel despus de tener un beb.  SOLICITE ATENCIN MDICA SI:  Los sntomas interfieren con sus actividades diarias.   Necesita medicamentos para [employer].   Debe colocarse un pesario.   Nota un sangrado que proviene de la vagina.   Cree que tiene [employer] cuello del tero.   La temperatura oral se eleva sin motivo por arriba de 102 F (38.9 C).   Siente dolor al Geographical information systems officer u [place].   Presenta sangrado al realizar un movimiento intestinal.   Los sntomas interfieren con su vida sexual.   Tiene incontinencia urinaria y sta interfiere en sus actividades diarias.   Pierde orina durante las  [employer].   Tiene tos crnica.   Sufre estreimiento crnico.  Document Released: 10/09/2008 Document Revised: 06/12/2011 [facility] Patient Information 2012 [place], [place].Reparacin del cistocele (Cystocele Repair) El cistocele es una hernia o protrusin de la vejiga en la vagina (canal del parto). Este abultamiento o protrusin se produce en la pared anterior de la vagina.  CAUSAS Tiene relacin con la debilidad de la zona anterosuperior de la pared vaginal debido a la distensin y ruptura de los tejidos en la zona, y generalmente es consecuencia de:  Partos mltiples.   Levantar objetos pesados de manera continua.   Por tos crnica debido al asma, enfisema o al hbito de fumar.   Por sobrepeso.   Cambios que surgen por el proceso de envejecimiento.   Ciruga previa en la zona vaginal.   Menopausia con disminucin de estrgenos y debilitamiento de los ligamentos y los msculos que rodean la vejiga.  SNTOMAS  Incontinencia urinaria, prdida descontrolada de orina al toser, estornudar o al hacer ejercicio.   Presin plvica.   Necesidad de Geographical information systems officer con frecuencia o con urgencia debido a la imposibilidad de vaciar completamente la vejiga.   Infecciones en la vejiga.  Necesidad de [employer] parte superior de la vagina para Chief Strategy Officer.  DIAGNSTICO Un cistocele puede diagnosticarse en un examen de la pelvis, cuando se observa que la parte superior de la vagina est cada o protruye hacia adentro o hacia afuera de la vagina. TRATAMIENTO Opciones quirrgicas:  La reparacin de cistocele es una ciruga que remueve la hernia.   Tambin existen diferentes operaciones de "cabestrillo" que pueden utilizarse.  Converse con el profesional acerca de los distintos tipos de cirugas para Advertising account planner. El profesional decidir cul opcin ser la mejor en su caso. Opciones no quirrgicas:  Los ejercicios de Kegel fortalecen los msculos y los tejidos de la vejiga  y la vagina.stas pueden ayudar en los casos moderados de cistocele.   La colocacin de un pesario puede ser de [place]. Un pesario es un dispositivo plstico o de goma que sostiene la Electronics engineer. El pesario debe ser colocado por el mdico.   Los tampones o diafragmas que levantan la vejiga a su lugar en algunos casos son tiles para los pequeos cistoceles.   El estrgeno puede ayudar en los casos moderados en mujeres menopusicas.  INFORME AL PROFESIONAL SOBRE LOS SIGUIENTES PUNTOS:  Alergias a alimentos o medicamentos.   Medicamentos que toma, incluyendo hierbas, gotas oftlmicas, medicamentos de venta libre y cremas.   Uso de esteroides (por va oral o cremas).   Problemas anteriores debido a anestsicos o a Patent examiner.   Antecedentes de hemorragias o cogulos sanguneos.   Cirugas previas.   Otros problemas de [place].   Posibilidad de embarazo, si correspondiera.  RIESGOS Y COMPLICACIONES Todas las cirugas conllevan riesgos.   La anestesia general implica riesgos. Podr conversar estos temas con el profesional que lo asiste.   En caso de anestesia raqudea o epidural, puede haber una zona que no se anestesie y Financial risk analyst.   Podr tener dolor de cabeza si le aplican una anestesia espinal o epidural.   Podra ocurrir que el catter que le coloquen luego de la ciruga no funcione correctamente o se bloquee.   Sangrado excesivo.   Infecciones.   Lesin en los rganos circundantes.   Puede haber recurrencia del cistocele.   Es posible que la ciruga no pueda eliminar los sntomas.  ANTES DEL PROCEDIMIENTO  No tome aspirina ni anticoagulantes durante la semana previa a la [place] a menos que le indiquen otra cosa.   No coma ni beba nada despus de la medianoche.   Informe al profesional que la asiste si se resfra o sufre algn problema infeccioso antes de la ciruga.   Si hace la admisin el da de la [place], deber presentarse una hora antes del  procedimiento a menos que el profesional que la asiste le indique otra cosa.   Solicite ayuda y planifique como ser su regreso a su casa.   Si fuma, no lo haga durante al [employer] antes de la [place].   No debe beber alcohol durante 3 das antes de la [place].  PROCEDIMIENTO Se le administrar un anestsico que lo mantendr sin dolor durante la [place]. Puede ser Neomia Dear anestesia general que la duerma, o una anestesia espinal o epidural. Usted dormir o estar adormecida durante todo el procedimiento. Durante la reparacin de cistocele, los tejidos son tensados de los costados y de la parte superior de la vagina para levantar la hernia. Esto remueve la hernia de forma tal que la parte superior de la vagina no cae en la apertura. DESPUS DEL PROCEDIMIENTO  Despus de la ciruga la llevarn al rea de recuperacin donde un(a) enfermero(a) la cuidar y controlar su respiracin, presin sangunea, pulso, y su progreso. Cuando el profesional la encuentre estable, la llevarn a su habitacin. Le colocarn un tubo de drenaje (sonda de Foley) que drenar su vejiga durante 2 a 7 das o ms, hasta que la vejiga funcione correctamente. Este catter se coloca antes de la ciruga para ayudar a Photographer vejiga vaca todo el tiempo que dure el procedimiento. Despus de la [place], har que orine con mayor facilidad. Se lo retirarn cuando pueda orinar fcilmente sin ayuda. Le colocarn una gasa en la vagina, la que ser removida luego de uno o [place]. Normalmente le darn un medicamento que elimina grmenes (antibiticos). Podr volver a su casa despus de tres o [employer]. INSTRUCCIONES PARA EL CUIDADO DOMICILIARIO  Tome duchas en vez de baos de inmersin hasta que le indiquen lo contrario.   [employer], especialmente los antibiticos que el mdico le ha indicado.   Practique actividad fsica como se le indic, pero no haga ejercicios que aumenten la presin en el abdomen  (vientre), como abdominales o levantar peso, hasta que el cirujano se lo permita. El caminar es el ejercicio de preferencia.   Slo tome medicamentos de venta libre o prescriptos para la fiebre y Environmental health practitioner, segn las indicaciones de su mdico.   No consuma bebidas alcohlicas mientras toma analgsicos.   No levante objetos de ms de 5 libras (2,5 kg).   No conduzca hasta que el mdico la autorice.   Descanse y duerma lo suficiente.   Tenga a alguien para ayudarla en las tareas domsticas durante 1 a 2 semanas.   Si est constipada, tome un laxante suave, si el mdico la autoriza. Consumir alimentos con salvado y beber suficiente lquido para Pharmacologist la orina de tono claro o color amarillo plido puede ayudarla con la constipacin.   No utilice aspirinas, ya que Heritage manager.   Puede reanudar su dieta y sus actividades normales (no extenuantes) segn se le haya indicado.   No se haga duchas vaginales ni mantenga relaciones sexuales hasta que el cirujano se lo permita.   Cambie el vendaje tal como se le indic.   Cumpla con todos los controles del postoperatorio.  SOLICITE ATENCIN MDICA SI:  Brett Fairy hemorragia vaginal anormal.   Aparece una erupcin cutnea.   Tiene una reaccin adversa a los medicamentos.   Presenta nuseas o vmitos.  SOLICITE ATENCIN MDICA DE INMEDIATO SI:  Presenta enrojecimiento, hinchazn o aumento del dolor en la zona vaginal.   Aparece pus en la vagina.   Tiene fiebre.   Advierte un olor ftido que proviene de la vagina.   Siente un dolor abdominal en aumento.   Siente frecuentes deseos de Geographical information systems officer, o al hacerlo tiene sensacin de ardor.   Nota sangre en la orina.   La hemorragia vaginal es excesiva.   No puede orinar.  ASEGRESE QUE:  Comprende estas instrucciones.   Controlar su enfermedad.   Solicitar ayuda inmediatamente si no mejora o si empeora.  Document Released: 06/23/2005 Document Revised:  06/12/2011 [facility] Patient Information 2012 [place], [place].

## 2013-05-18 ENCOUNTER — Other Ambulatory Visit: Payer: Self-pay

## 2013-07-12 ENCOUNTER — Ambulatory Visit: Payer: Self-pay | Admitting: Family Medicine

## 2013-07-20 ENCOUNTER — Encounter: Payer: Self-pay | Admitting: Family Medicine

## 2013-07-20 ENCOUNTER — Ambulatory Visit (INDEPENDENT_AMBULATORY_CARE_PROVIDER_SITE_OTHER): Payer: 59 | Admitting: Family Medicine

## 2013-07-20 VITALS — BP 110/76 | HR 78 | Temp 98.3°F | Resp 18 | Ht 59.5 in | Wt 183.0 lb

## 2013-07-20 DIAGNOSIS — L219 Seborrheic dermatitis, unspecified: Secondary | ICD-10-CM

## 2013-07-20 DIAGNOSIS — Z23 Encounter for immunization: Secondary | ICD-10-CM

## 2013-07-20 DIAGNOSIS — L218 Other seborrheic dermatitis: Secondary | ICD-10-CM

## 2013-07-20 DIAGNOSIS — K2990 Gastroduodenitis, unspecified, without bleeding: Secondary | ICD-10-CM

## 2013-07-20 DIAGNOSIS — K2970 Gastritis, unspecified, without bleeding: Secondary | ICD-10-CM

## 2013-07-20 DIAGNOSIS — K219 Gastro-esophageal reflux disease without esophagitis: Secondary | ICD-10-CM

## 2013-07-20 MED ORDER — KETOCONAZOLE 2 % EX SHAM: 1.0000 "application " | MEDICATED_SHAMPOO | CUTANEOUS | Status: DC

## 2013-07-20 MED ORDER — OMEPRAZOLE 40 MG PO CPDR: 40.0000 mg | DELAYED_RELEASE_CAPSULE | Freq: Every day | ORAL | Status: DC

## 2013-07-20 NOTE — Assessment & Plan Note (Signed)
Treat with Nizoral shampoo twice a week for 8 week

## 2013-07-20 NOTE — Progress Notes (Signed)
   Subjective:    Patient ID: Lindsey Singleton, female    DOB: 1971/06/16, 43 y.o.   MRN: [id number]  HPI  Patient here to establish care. She's been speaking therefore interview was done with her daughter as well as an interpreter. Her previous PCP appears to be [facility] but she has not been seen in the past 3 years. [facility] secondary to multiple issues with her left ankle requiring multiple surgeries she's been on crutches for the past 5 years.  She has a history of hyperlipidemia however this has not been checked in some time. She also has a remote history of asthma and occasionally gets bronchitis but she's not on any medications. She does complain of burning sensation bloating and reflux symptoms over the past few months. She is on meloxicam daily which she has been on chronically because of her ankle problem. She did see her orthopedist yesterday who recommended that she try to stop the meloxicam.  She also complains of red spots that itch in her scalp that come and go they also flake. She's not used any medications for this. His present for many months. She has not had any hair loss from the lesions.  Due for Pap smear mammogram Due for tetanus boost Due for flu shot  Review of Systems  GEN- denies fatigue, fever, weight loss,weakness, recent illness HEENT- denies eye drainage, change in vision, nasal discharge, CVS- denies chest pain, palpitations RESP- denies SOB, cough, wheeze ABD- denies N/V, change in stools, +abd pain GU- denies dysuria, hematuria, dribbling, incontinence MSK- denies joint pain, muscle aches, injury Neuro- denies headache, dizziness, syncope, seizure activity      Objective:   Physical Exam GEN- NAD, alert and oriented x3 HEENT- PERRL, EOMI, non injected sclera, pink conjunctiva, MMM, oropharynx clear Neck- Supple, no LAD CVS- RRR, no murmur RESP-CTAB ABD-NABS,soft,NT,ND EXT- No edema Pulses- Radial, DP- 2+ Skin-  Scalp-few scattered  erythema with papular lesions with flaking/scales at nape of neck and in center of scalp. Some lesions with a yellow crust       Assessment & Plan:

## 2013-07-20 NOTE — Patient Instructions (Addendum)
Release of records [facility] Release of records- [facility]  Stop the meloxicam  Start omeprazole once a day before breakfast Flu shot given  F/U for Physical- fasting labs-- 1 week for AM   Gastritis - Adultos  (Gastritis, Adult)  Gastrittis es la hinchazn e irritacin (inflamacin) del revestimiento interno del estmago. Si no recibe tratamiento, la gastritis puede causar sangrado y llagas.(lceras) en el estmago. CUIDADOS EN EL HOGAR   Slo tome los medicamentos segn le indique el mdico.  Si le han recetado antibiticos, tmelos segn las indicaciones. Termine de Leggett & Platttomar el medicamento, aunque comience a sentirse mejor.  Beba gran cantidad de lquido para mantener el pis (orina) de tono claro o amarillo plido.  Evite las comidas y bebidas que United Stationersempeoran los problemas. Los alimentos que debe evitar son:  Geri SeminoleCafena y alcohol.  Chocolate.  Menta.  Ajo y cebolla.  Comidas muy condimentadas.  Ctricos como naranjas, limones o limas.  Alimentos que contengan tomate, como salsas, [place] y pizza.  Alimentos fritos y Lexicographergrasos.  Haga comidas pequeas durante Glass blower/designerel da en lugar de 3 comidas abundantes. SOLICITE AYUDA DE INMEDIATO SI:   La materia fecal (heces)es negra o de color rojo oscuro.  Vomita sangre. Puede ser similar a la borra del caf  No puede retener los lquidos.  El dolor en el vientre (abdominal) empeora.  Tiene fiebre.  No mejora luego de 1 semana.  Tiene preguntas o preocupaciones. ASEGRESE DE QUE:   Comprende estas instrucciones.  Controlar su enfermedad.  Solicitar ayuda de inmediato si no mejora o si empeora. Document Released: 12/23/2011 [facility] Patient Information 2014 [place], [place]. Dermatitis seborreica (Seborrheic Dermatitis) La dermatitis seborreica se observa como una zona de color rojo o rosado en la piel con escamas grasas . Generalmente ocurre en las zonas donde hay mas glndulas sebceas. Este problema tambin se  conoce con el nombre de caspa. Cuando afecta el cuero cabelludo de un beb, se denomina costra lctea. Puede aparecer y desaparecer sin motivo conocido. Puede ocurrir en cualquier momento de la vida, desde la infancia hasta la vejez.  TRATAMIENTO  Ungentos con corticoides, cremas y lociones ayudan a disminuir la inflamacin.  Los bebs pueden tratarse con aceite infantil para ablandar las escamas y luego se lava el cuero cabelludo con champ para bebs. Si esto no ayuda, un corticoide tpico recetado puede ser de [place] G[place] Lakeutilidad.  Los adultos tambin pueden usar Jones Apparel Groupchampes medicinales.  El mdico prescribir una crema con corticoides y un champ que contenga un medicamento contra los hongos (ketoconazole). La crema con hidrocortisona o la antihongos pueden frotarse directamente en las zonas de la dermatitis seborreica. Los hongos no causan el trastorno Engineer, maintenancepero parecen agravarlo.  En los bebs, no retire las escamas del cuero cabelludo de Canadamanera agresiva con peine ni con otros medios. Esto puede causarle la prdida del cabello. SOLICITE ATENCIN MDICA SI:  El problema no mejora con los champs medicinales, las lociones u otros medicamentos que le prescriba el mdico.  Tiene preguntas o preocupaciones. Document Released: 06/23/2005 Document Revised: 12/23/2011 [facility] Patient Information 2014 [place]Care, [place]LL[place].

## 2013-07-20 NOTE — Addendum Note (Signed)
Addended by: Elvina MattesSIMMONS, Margaruite Top T on: 07/20/2013 05:06 PM   Modules accepted: Orders

## 2013-07-20 NOTE — Assessment & Plan Note (Signed)
She has both sides of gastritis and acid reflux we'll put her on omeprazole

## 2013-07-20 NOTE — Assessment & Plan Note (Signed)
We will stop the long-term NSAID and put her on omeprazole. Her labs will be checked next week when she comes in for her physical

## 2013-07-27 ENCOUNTER — Encounter: Payer: Self-pay | Admitting: Family Medicine

## 2013-07-27 ENCOUNTER — Ambulatory Visit (INDEPENDENT_AMBULATORY_CARE_PROVIDER_SITE_OTHER): Payer: 59 | Admitting: Family Medicine

## 2013-07-27 VITALS — BP 114/70 | HR 78 | Temp 97.8°F | Resp 18 | Ht 59.0 in | Wt 185.0 lb

## 2013-07-27 DIAGNOSIS — Z13220 Encounter for screening for lipoid disorders: Secondary | ICD-10-CM

## 2013-07-27 DIAGNOSIS — E669 Obesity, unspecified: Secondary | ICD-10-CM | POA: Insufficient documentation

## 2013-07-27 DIAGNOSIS — Z0000 Encounter for general adult medical examination without abnormal findings: Secondary | ICD-10-CM

## 2013-07-27 DIAGNOSIS — Z13228 Encounter for screening for other metabolic disorders: Secondary | ICD-10-CM

## 2013-07-27 DIAGNOSIS — Z130 Encounter for screening for diseases of the blood and blood-forming organs and certain disorders involving the immune mechanism: Secondary | ICD-10-CM

## 2013-07-27 DIAGNOSIS — Z1329 Encounter for screening for other suspected endocrine disorder: Secondary | ICD-10-CM

## 2013-07-27 DIAGNOSIS — Z1321 Encounter for screening for nutritional disorder: Secondary | ICD-10-CM

## 2013-07-27 DIAGNOSIS — Z124 Encounter for screening for malignant neoplasm of cervix: Secondary | ICD-10-CM

## 2013-07-27 DIAGNOSIS — Z1231 Encounter for screening mammogram for malignant neoplasm of breast: Secondary | ICD-10-CM

## 2013-07-27 DIAGNOSIS — Z23 Encounter for immunization: Secondary | ICD-10-CM

## 2013-07-27 LAB — CBC WITH DIFFERENTIAL/PLATELET
BASOS ABS: 0 10*3/uL (ref 0.0–0.1)
BASOS PCT: 0 % (ref 0–1)
EOS ABS: 0.4 10*3/uL (ref 0.0–0.7)
EOS PCT: 4 % (ref 0–5)
HCT: 40.9 % (ref 36.0–46.0)
Hemoglobin: 13.6 g/dL (ref 12.0–15.0)
Lymphocytes Relative: 37 % (ref 12–46)
Lymphs Abs: 3.5 10*3/uL (ref 0.7–4.0)
MCH: 28 pg (ref 26.0–34.0)
MCHC: 33.3 g/dL (ref 30.0–36.0)
MCV: 84.2 fL (ref 78.0–100.0)
Monocytes Absolute: 0.5 10*3/uL (ref 0.1–1.0)
Monocytes Relative: 5 % (ref 3–12)
Neutro Abs: 4.9 10*3/uL (ref 1.7–7.7)
Neutrophils Relative %: 54 % (ref 43–77)
PLATELETS: 271 10*3/uL (ref 150–400)
RBC: 4.86 MIL/uL (ref 3.87–5.11)
RDW: 13.7 % (ref 11.5–15.5)
WBC: 9.2 10*3/uL (ref 4.0–10.5)

## 2013-07-27 LAB — COMPREHENSIVE METABOLIC PANEL
ALBUMIN: 3.9 g/dL (ref 3.5–5.2)
ALK PHOS: 63 U/L (ref 39–117)
ALT: 24 U/L (ref 0–35)
AST: 18 U/L (ref 0–37)
BILIRUBIN TOTAL: 0.3 mg/dL (ref 0.3–1.2)
BUN: 9 mg/dL (ref 6–23)
CO2: 21 meq/L (ref 19–32)
Calcium: 9 mg/dL (ref 8.4–10.5)
Chloride: 102 mEq/L (ref 96–112)
Creat: 0.59 mg/dL (ref 0.50–1.10)
Glucose, Bld: 80 mg/dL (ref 70–99)
POTASSIUM: 4.1 meq/L (ref 3.5–5.3)
SODIUM: 135 meq/L (ref 135–145)
TOTAL PROTEIN: 6.7 g/dL (ref 6.0–8.3)

## 2013-07-27 LAB — LIPID PANEL
Cholesterol: 264 mg/dL — ABNORMAL HIGH (ref 0–200)
HDL: 52 mg/dL (ref >39)
LDL Cholesterol: 157 mg/dL — ABNORMAL HIGH (ref 0–99)
Total CHOL/HDL Ratio: 5.1 Ratio
Triglycerides: 273 mg/dL — ABNORMAL HIGH (ref <150)
VLDL: 55 mg/dL — ABNORMAL HIGH (ref 0–40)

## 2013-07-27 NOTE — Patient Instructions (Signed)
We will call with lab results Continue current medications Mammogram to be set up Tetanus Booster Work on diet and weight loss F/U 3 months

## 2013-07-27 NOTE — Progress Notes (Signed)
   Subjective:    Patient ID: Lindsey Singleton, femaDustin Singleton    DOB: 11/01/1970, 43 y.o.   MRN: [id number]  HPI  Patient here for complete physical exam with Pap smear. She's not had a Pap smear in about 3 years. Her last menstrual period was a week ago. She is overdue for mammogram. She is due for tetanus booster She is due for fasting labs She has no specific concerns today. Her abdominal pain is improved with the omeprazole which she has been on for one week. She does have a history of bladder prolapse as well as likely rectocele she states that she had a "ball" that would appear in the lower part of her vagina on and off and cause pain with sexual intercourse. She was evaluated by GYN and 2013 and they discussed pessary with her however she did not return. She states [place] years [place] and they stated that she needed surgery. She currently does not have the"ball"in the vaginal area at this time.  Review of Systems   GEN- denies fatigue, fever, weight loss,weakness, recent illness HEENT- denies eye drainage, change in vision, nasal discharge, CVS- denies chest pain, palpitations RESP- denies SOB, cough, wheeze ABD- denies N/V, change in stools, abd pain GU- denies dysuria, hematuria, dribbling, incontinence MSK- + joint pain, muscle aches, injury Neuro- denies headache, dizziness, syncope, seizure activity      Objective:   Physical Exam GEN- NAD, alert and oriented x3 HEENT- PERRL, EOMI, non injected sclera, pink conjunctiva, MMM, oropharynx clear Neck- Supple, no LAD Breast- normal symmetry, no nipple inversion,no nipple drainage, no nodules or lumps felt Nodes- no axillary nodes CVS- RRR, no murmur RESP-CTAB GU- normal external genitalia, vaginal mucosa pink and moist, cervix visualized no growth, no blood form os,No discharge, no CMT, no ovarian masses, uterus normal size, +bladder prolapse ABD-NABS,soft,NT,ND EXT- No edema Pulses- Radial 2+         Assessment & Plan:    CPE w/ PAP- No signs of rectocele on exam. Fasting labs, TDAP, refer for Mammogram   If the "ball" like lesion returns pt to schedule appt for evaluation

## 2013-07-27 NOTE — Addendum Note (Signed)
Addended by: Milinda AntisURHAM, KAWANTA F on: 07/27/2013 08:41 AM   Modules accepted: Level of Service

## 2013-07-27 NOTE — Addendum Note (Signed)
Addended by: Elvina MattesSIMMONS, Jurgen Groeneveld T on: 07/27/2013 09:13 AM   Modules accepted: Orders

## 2013-07-27 NOTE — Assessment & Plan Note (Signed)
Exercise is limited due to chronic ankle injury/surgeries Discussed diet, getting rid of "white foods" decreasing Tea and soda Short term goals set

## 2013-07-28 LAB — PAP THINPREP ASCUS RFLX HPV RFLX TYPE

## 2013-07-28 LAB — VITAMIN D 25 HYDROXY (VIT D DEFICIENCY, FRACTURES): VIT D 25 HYDROXY: 20 ng/mL — AB (ref 30–89)

## 2013-07-28 MED ORDER — VITAMIN D (ERGOCALCIFEROL) 1.25 MG (50000 UNIT) PO CAPS: 50000.0000 [IU] | ORAL_CAPSULE | ORAL | Status: DC

## 2013-07-28 MED ORDER — SIMVASTATIN 10 MG PO TABS
10.0000 mg | ORAL_TABLET | Freq: Every day | ORAL | Status: DC
Start: 2013-07-28 — End: 2013-08-18

## 2013-07-28 NOTE — Addendum Note (Signed)
Addended by: Milinda AntisURHAM, Teiara Baria F on: 07/28/2013 01:41 PM   Modules accepted: Orders

## 2013-08-08 ENCOUNTER — Ambulatory Visit
Admission: RE | Admit: 2013-08-08 | Discharge: 2013-08-08 | Disposition: A | Discharge status: Home / Self Care | Payer: 59 | Source: Ambulatory Visit | Attending: Family Medicine | Admitting: Family Medicine

## 2013-08-08 DIAGNOSIS — Z1231 Encounter for screening mammogram for malignant neoplasm of breast: Secondary | ICD-10-CM

## 2013-08-16 ENCOUNTER — Telehealth: Payer: Self-pay | Admitting: *Deleted

## 2013-08-16 NOTE — Telephone Encounter (Signed)
Simvastatin is causing bone pain, dry mouth, thinks the medicine is not helping her wants to know what to do.Numer [phone] interpreters number.

## 2013-08-17 NOTE — Telephone Encounter (Signed)
Call pt, we will try a a different statin drug, called pravastatin, take at bedtime   D/C SImvastatin, Send Prescription for start pravastatin 20mg  at bedtime #30, R 3

## 2013-08-18 MED ORDER — PRAVASTATIN SODIUM 20 MG PO TABS: 20.0000 mg | ORAL_TABLET | Freq: Every day | ORAL | Status: DC

## 2013-08-18 NOTE — Telephone Encounter (Signed)
Lindsey FanningJulie interpreter informed to tell pt to stop taking simvastatin and start new pravastatin, she wil call the pt and let her know.

## 2013-09-05 ENCOUNTER — Ambulatory Visit (INDEPENDENT_AMBULATORY_CARE_PROVIDER_SITE_OTHER): Payer: 59 | Admitting: Physician Assistant

## 2013-09-05 ENCOUNTER — Encounter: Payer: Self-pay | Admitting: Physician Assistant

## 2013-09-05 VITALS — BP 106/66 | HR 76 | Temp 98.1°F | Resp 18 | Ht 59.5 in | Wt 178.0 lb

## 2013-09-05 DIAGNOSIS — E785 Hyperlipidemia, unspecified: Secondary | ICD-10-CM

## 2013-09-05 DIAGNOSIS — R21 Rash and other nonspecific skin eruption: Secondary | ICD-10-CM

## 2013-09-05 MED ORDER — VALACYCLOVIR HCL 1 G PO TABS: 1000.0000 mg | ORAL_TABLET | Freq: Two times a day (BID) | ORAL | Status: DC

## 2013-09-05 NOTE — Progress Notes (Signed)
Patient ID: Lindsey Singleton MRN: [id number], DOB: 10-27-1970, 43 y.o. Date of Encounter: @DATE @  Chief Complaint:  Chief Complaint  Patient presents with  . c/o bump in peri area    5 days, also having reaction to pravastatin    HPI: 43 y.o. year old Hispanic female  presents with her daughter. Patient speaks very minimal English but her daughter speaks fluent [place] and is able to translate.   1- they report that recently patient was switched to pravastatin. She took 2 doses of this but then developed swelling in the right side of her face..Stopped the pravastatin and the swelling resolved.  2- patient reports having a  'bump" in her perineal area--" between her vaginal area and rectal area". Says this is the second time she has felt a bump there. Says the first time was about a year ago. That lesion resolved spontaneously- without any medical evaluation or treatment. This episode started 6 days ago. Says at the beginning it was burning and clear fluid came from it like what she has seen come from burn blister in the past.   Past Medical History  Diagnosis Date  . Asthma   . Chronic kidney disease     recurrent pyelo  . Gastritis     no current medication  . Hyperlipidemia     no medication     Home Meds: See attached medication section for current medication list. Any medications entered into computer today will not appear on this note's list. The medications listed below were entered prior to today. Current Outpatient Prescriptions on File Prior to Visit  Medication Sig Dispense Refill  . ketoconazole (NIZORAL) 2 % shampoo Apply 1 application topically 2 (two) times a week. For 8 weeks  120 mL  2  . meloxicam (MOBIC) 7.5 MG tablet Take 7.5 mg by mouth daily as needed.      Marland Kitchen omeprazole (PRILOSEC) 40 MG capsule Take 1 capsule (40 mg total) by mouth daily.  30 capsule  3  . traMADol (ULTRAM) 50 MG tablet Take 50 mg by mouth every 6 (six) hours as needed.      . Vitamin D,  Ergocalciferol, (DRISDOL) 50000 UNITS CAPS capsule Take 1 capsule (50,000 Units total) by mouth every 7 (seven) days.  30 capsule  2   No current facility-administered medications on file prior to visit.    Allergies:  Allergies  Allergen Reactions  . Cephalexin Hives and Itching  . Simvastatin Other (See Comments)    Bone pain, dry mouth  . Gabapentin   . Pravastatin Swelling    Facial swelling and stomach pains    History   Social History  . Marital Status: Married    Spouse Name: N/A    Number of Children: N/A  . Years of Education: N/A   Occupational History  . Not on file.   Social History Main Topics  . Smoking status: Never Smoker   . Smokeless tobacco: Not on file  . Alcohol Use: No  . Drug Use: No  . Sexual Activity: No   Other Topics Concern  . Not on file   Social History Narrative  . No narrative on file    Family History  Problem Relation Age of Onset  . Heart disease Father   . Cancer Brother     leukemia  . Diabetes Maternal Aunt   . Diabetes Maternal Uncle      Review of Systems:  See HPI for pertinent ROS. All other  ROS negative.    Physical Exam: Blood pressure 106/66, pulse 76, temperature 98.1 F (36.7 C), temperature source Oral, resp. rate 18, height 4' 11.5" (1.511 m), weight 178 lb (80.74 kg)., Body mass index is 35.36 kg/(m^2). General: Hispanic Female. Appears in no acute distress. Neck: Supple. No thyromegaly. No lymphadenopathy. Lungs: Clear bilaterally to auscultation without wheezes, rales, or rhonchi. Breathing is unlabored. Heart: RRR with S1 S2. No murmurs, rubs, or gallops. Musculoskeletal:  Strength and tone normal for age. Skin: in Perineal area, there is an approx 1/2 cm diameter ulcerative area--very shallow.  I examined the surrounding skin including the vulva and the vaginal opening and the rest of the perineum and perianal area. I see no other ulcerative areas and no vesicles or other skin lesions. Neuro: Alert  and oriented X 3. Moves all extremities spontaneously. Gait is normal. CNII-XII grossly in tact. Psych:  Responds to questions appropriately with a normal affect.     ASSESSMENT AND PLAN:  [age] y.o. year old female with  1. 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  Will go ahead and treat for this but will send lab to confirm. - HSV(herpes smplx)abs-1+2(IgG+IgM)-bld - RPR - valACYclovir (VALTREX) 1000 MG tablet; Take 1 tablet (1,000 mg total) by mouth 2 (two) times daily.  Dispense: 20 tablet; Refill: 0 - HIV antibody  2. Hyperlipidemia I reviewed her chart. She recently had a complete physical exam and lipid panel done as part of that. Recently saw Dr. Jeanice Limurham. Lab was done on 07/27/13. At that time Dr. Jeanice Limurham started Zocor 10 mg.  08/16/13 there is a phone message with patient calling to report myalgias with Zocor so it was stopped. Zocor was changed to Pravachol. Now working swelling of the right side of the face after 2 doses of pravastatin. Resolved with stopping pravastatin. Told her to stay off of the pravastatin as well. If she is intolerant of pravastatin, she probably will not be able to tolerate any statin. I Considered starting Zetia. However her lipid panel is not severely elevated and she does not have many cardiac risk factors. Will just leave her off of treatment at this time and let her wait to followup with Dr. Jeanice Limurham regarding this at her next regular visit.    Murray HodgkinsSigned, Amando Ishikawa Beth FriantDixon, [place], [facility] 09/05/2013 7:25 PM

## 2013-09-06 LAB — HSV(HERPES SMPLX)ABS-I+II(IGG+IGM)-BLD
HERPES SIMPLEX VRS I-IGM AB (EIA): 2.6 {index} — AB
HSV 1 GLYCOPROTEIN G AB, IGG: 0.14 IV
HSV 2 GLYCOPROTEIN G AB, IGG: 6.26 IV — AB

## 2013-09-06 LAB — HIV ANTIBODY (ROUTINE TESTING W REFLEX): HIV: NONREACTIVE

## 2013-09-06 LAB — RPR

## 2013-09-12 ENCOUNTER — Encounter: Payer: Self-pay | Admitting: Physician Assistant

## 2013-09-12 ENCOUNTER — Ambulatory Visit (INDEPENDENT_AMBULATORY_CARE_PROVIDER_SITE_OTHER): Payer: 59 | Admitting: Physician Assistant

## 2013-09-12 DIAGNOSIS — B009 Herpesviral infection, unspecified: Secondary | ICD-10-CM

## 2013-09-12 DIAGNOSIS — N760 Acute vaginitis: Secondary | ICD-10-CM

## 2013-09-12 HISTORY — DX: Herpesviral infection, unspecified: B00.9

## 2013-09-12 LAB — WET PREP FOR TRICH, YEAST, CLUE
Clue Cells Wet Prep HPF POC: NONE SEEN
Trich, Wet Prep: NONE SEEN
YEAST WET PREP: NONE SEEN

## 2013-09-12 MED ORDER — VALACYCLOVIR HCL 500 MG PO TABS: 500.0000 mg | ORAL_TABLET | Freq: Every day | ORAL | Status: DC

## 2013-09-12 NOTE — Progress Notes (Signed)
Patient ID: Lindsey Singleton MRN: [id number], DOB: 1971/02/04, 43 y.o. Date of Encounter: @DATE @  Chief Complaint:  Chief Complaint  Patient presents with  . here to discuss recent lab results    HPI: 43 y.o. year old Hispanic female  presents with Spanish interpreter/translator. She was recently seen by me for an office visit. At that time she had complained of a sore in her perineum. Clinically I suspected that it was herpes and went ahead and treated her with Valtrex at that visit. I obtained lab work including HSV, RPR, HIV. 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 The other 2 tests were negative. Patient is here to discuss lab results.  Also at the end of the visit she reports that she is also having some vaginal discharge.  She states that she has been married for 24 years and has discussed STDs with her husband who has denied having any other sexual partners. Patient states that she has had no sexual partners except for her husband.   Past Medical History  Diagnosis Date  . Asthma   . Chronic kidney disease     recurrent pyelo  . Gastritis     no current medication  . Hyperlipidemia     no medication  . 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 Any medications entered into computer today will not appear on this note's list. The medications listed below were entered prior to today. Current Outpatient Prescriptions on File Prior to Visit  Medication Sig Dispense Refill  . ketoconazole (NIZORAL) 2 % shampoo Apply 1 application topically 2 (two) times a week. For 8 weeks  120 mL  2  . meloxicam (MOBIC) 7.5 MG tablet Take 7.5 mg by mouth daily as needed.      Marland Kitchen. omeprazole (PRILOSEC) 40 MG capsule Take 1 capsule (40 mg total) by mouth daily.  30 capsule  3  . traMADol (ULTRAM) 50 MG tablet Take 50 mg by mouth every 6 (six) hours as needed.      . valACYclovir (VALTREX) 1000 MG tablet Take 1 tablet (1,000 mg  total) by mouth 2 (two) times daily.  20 tablet  0  . Vitamin D, Ergocalciferol, (DRISDOL) 50000 UNITS CAPS capsule Take 1 capsule (50,000 Units total) by mouth every 7 (seven) days.  30 capsule  2   No current facility-administered medications on file prior to visit.    Allergies:  Allergies  Allergen Reactions  . Cephalexin Hives and Itching  . Simvastatin Other (See Comments)    Bone pain, dry mouth  . Gabapentin   . Pravastatin Swelling    Facial swelling and stomach pains    History   Social History  . Marital Status: Married    Spouse Name: N/A    Number of Children: N/A  . Years of Education: N/A   Occupational History  . Not on file.   Social History Main Topics  . Smoking status: Never Smoker   . Smokeless tobacco: Not on file  . Alcohol Use: No  . Drug Use: No  . Sexual Activity: No   Other Topics Concern  . Not on file   Social History Narrative  . No narrative on file    Family History  Problem Relation Age of Onset  . Heart disease Father   . Cancer Brother     leukemia  . Diabetes Maternal Aunt   . Diabetes Maternal Uncle  Review of Systems:  See HPI for pertinent ROS. All other ROS negative.    Physical Exam: There were no vitals taken for this visit., There is no weight on file to calculate BMI. General: Overweight Hispanic female Appears in no acute distress. Neck: Supple. No thyromegaly. No lymphadenopathy. Lungs: Clear bilaterally to auscultation without wheezes, rales, or rhonchi. Breathing is unlabored. Heart: RRR with S1 S2. No murmurs, rubs, or gallops. Musculoskeletal:  Strength and tone normal for age. Extremities/Skin: Warm and dry. Pelvic Exam: External genitalia normal. Vaginal mucosa normal. Large amount of blood is present. No discharge visualized. Neuro: Alert and oriented X 3. Moves all extremities spontaneously. Gait is normal. CNII-XII grossly in tact. Psych:  Responds to questions appropriately with a normal  affect.     ASSESSMENT AND PLAN:  43 y.o. year old female with  1. Herpes simplex type 2 infection Discussed through the interpreter, that this is essentially transmitted disease and discussed what this means in the conversation she is to have her husband. Discussed that there is no cure to completely get rid of the herpes infection but she can take this daily Valtrex dose as suppressive therapy. This would prevent her from having further outbreaks and also prevent viral shedding to her husband/sexual partner. - valACYclovir (VALTREX) 500 MG tablet; Take 1 tablet (500 mg total) by mouth daily.  Dispense: 30 tablet; Refill: 11  2. Vaginitis and vulvovaginitis She stated she just started her menses this morning but was only having a very small amount of spotting. However when I went to do the exam there is a large amount of blood present. Went ahead and did swabs. However I told them that if this these tests show up negative and only sees blood but she continues to have vaginal discharge and we will have her come in once her menses is over and repeat the test. - GC/Chlamydia Probe Amp - WET PREP FOR TRICH, YEAST, CLUE  STD screening: At her recent visit I also checked RPR and HIV which were negative. Wet prep we'll check for Trichomonas and we can also check gonorrhea chlamydia as above. I did not do a specific hepatitis panel but she did recently have CMET 07/27/13 with normal LFTs so I doubt hepatitis.  Signed, [address] [place], [place], BSFM 09/12/2013 9:30 AM

## 2013-09-13 LAB — GC/CHLAMYDIA PROBE AMP
CT Probe RNA: NEGATIVE
GC Probe RNA: NEGATIVE

## 2013-09-15 ENCOUNTER — Encounter: Payer: Self-pay | Admitting: Physician Assistant

## 2013-09-15 ENCOUNTER — Ambulatory Visit (INDEPENDENT_AMBULATORY_CARE_PROVIDER_SITE_OTHER): Payer: 59 | Admitting: Physician Assistant

## 2013-09-15 DIAGNOSIS — B009 Herpesviral infection, unspecified: Secondary | ICD-10-CM

## 2013-09-16 NOTE — Progress Notes (Signed)
Patient ID: Lindsey FlockLuz Singleton MRN: [id number], DOB: 1971-02-19, 43 y.o. Date of Encounter: 09/16/2013, 6:09 AM    Chief Complaint:  Chief Complaint  Patient presents with  . patient and spouse here for consult with provider     HPI: 43 y.o. year old Hispanic  female here for followup visit regarding herpes type II.  Spanish translator is here to interpret. Patient recently saw me for a visit and was diagnosed for herpes simplex type II. She brought her patient husband and today we did discuss this with him. As well patient is taking her treatment dose of Valtrex currently. However she is feeling some bumps in her genitalia area and wants me to check those as well.     Home Meds: See attached medication section for any medications that were entered at today's visit. The computer does not put those onto this list.The following list is a list of meds entered prior to today's visit.   Current Outpatient Prescriptions on File Prior to Visit  Medication Sig Dispense Refill  . ketoconazole (NIZORAL) 2 % shampoo Apply 1 application topically 2 (two) times a week. For 8 weeks  120 mL  2  . meloxicam (MOBIC) 7.5 MG tablet Take 7.5 mg by mouth daily as needed.      Marland Kitchen. omeprazole (PRILOSEC) 40 MG capsule Take 1 capsule (40 mg total) by mouth daily.  30 capsule  3  . traMADol (ULTRAM) 50 MG tablet Take 50 mg by mouth every 6 (six) hours as needed.      . valACYclovir (VALTREX) 1000 MG tablet Take 1 tablet (1,000 mg total) by mouth 2 (two) times daily.  20 tablet  0  . valACYclovir (VALTREX) 500 MG tablet Take 1 tablet (500 mg total) by mouth daily.  30 tablet  11  . Vitamin D, Ergocalciferol, (DRISDOL) 50000 UNITS CAPS capsule Take 1 capsule (50,000 Units total) by mouth every 7 (seven) days.  30 capsule  2   No current facility-administered medications on file prior to visit.    Allergies:  Allergies  Allergen Reactions  . Cephalexin Hives and Itching  . Simvastatin Other (See Comments)   Bone pain, dry mouth  . Gabapentin   . Pravastatin Swelling    Facial swelling and stomach pains      Review of Systems: See HPI for pertinent ROS. All other ROS negative.    Physical Exam: There were no vitals taken for this visit., There is no weight on file to calculate BMI. General:  Appears in no acute distress. Lungs: Clear bilaterally to auscultation without wheezes, rales, or rhonchi. Breathing is unlabored. Heart: Regular rhythm. No murmurs, rubs, or gallops. Msk:  Strength and tone normal for age. Pelvic exam: Complete pelvic exam not performed as this was just done one week ago. External genitalia inspected. There are very tiny approximate 2 mm ulcerative areas along the medial aspect of the vulva. Patient states that these are the areas that she has been feeling.  No other abnormal findings on exam. Extremities/Skin: Warm and dry. Neuro: Alert and oriented X 3. Moves all extremities spontaneously. Gait is normal. CNII-XII grossly in tact. Psych:  Responds to questions appropriately with a normal affect.     ASSESSMENT AND PLAN:  43 y.o. year old female with  1. Herpes simplex type 2 infection I discussed with the patient that the bumps that she has been feeling are consistent with herpes. She is to complete the higher/treatment dose of Valtrex. She is to  then go to the daily suppressive therapy dose of 500 mg daily. Had another discussion today with both the patient and her husband regarding herpes. All of their questions have been answered. Her husband is actually a patient of mine and is going to also be seen for visit today to start treatment for daily suppressive therapy of Valtrex. F/U 1 year or sooner if needed.   [address]ray [address] [place], [place], [facility] 09/16/2013 6:09 AM

## 2013-10-25 ENCOUNTER — Ambulatory Visit (INDEPENDENT_AMBULATORY_CARE_PROVIDER_SITE_OTHER): Payer: 59 | Admitting: Family Medicine

## 2013-10-25 ENCOUNTER — Encounter: Payer: Self-pay | Admitting: Family Medicine

## 2013-10-25 VITALS — BP 134/72 | HR 88 | Temp 98.0°F | Resp 16 | Ht 60.0 in | Wt 178.0 lb

## 2013-10-25 DIAGNOSIS — E785 Hyperlipidemia, unspecified: Secondary | ICD-10-CM

## 2013-10-25 DIAGNOSIS — N926 Irregular menstruation, unspecified: Secondary | ICD-10-CM

## 2013-10-25 DIAGNOSIS — J029 Acute pharyngitis, unspecified: Secondary | ICD-10-CM

## 2013-10-25 DIAGNOSIS — R079 Chest pain, unspecified: Secondary | ICD-10-CM

## 2013-10-25 DIAGNOSIS — J020 Streptococcal pharyngitis: Secondary | ICD-10-CM

## 2013-10-25 LAB — RAPID STREP SCREEN (MED CTR MEBANE ONLY): Streptococcus, Group A Screen (Direct): POSITIVE — AB

## 2013-10-25 LAB — PREGNANCY, URINE: Preg Test, Ur: NEGATIVE

## 2013-10-25 MED ORDER — AMOXICILLIN 500 MG PO CAPS: 500.0000 mg | ORAL_CAPSULE | Freq: Three times a day (TID) | ORAL | Status: DC

## 2013-10-25 MED ORDER — EZETIMIBE 10 MG PO TABS: 10.0000 mg | ORAL_TABLET | Freq: Every day | ORAL | Status: DC

## 2013-10-25 MED ORDER — DICLOFENAC SODIUM 1 % TD GEL: TRANSDERMAL | Status: DC

## 2013-10-25 MED ORDER — OMEPRAZOLE 40 MG PO CPDR: 40.0000 mg | DELAYED_RELEASE_CAPSULE | Freq: Every day | ORAL | Status: DC

## 2013-10-25 NOTE — Patient Instructions (Signed)
Your EKG is normal Stop the meloxicam Use the voltaren gel for your ankle Continue the ultram Take antibiotics for the strep throat Gargle salt water  Try the zetia for cholesterol  F/U 2 months

## 2013-10-25 NOTE — Assessment & Plan Note (Signed)
amox x 10 days

## 2013-10-25 NOTE — Assessment & Plan Note (Signed)
Will try her on zetia and see how she does

## 2013-10-25 NOTE — Assessment & Plan Note (Signed)
D/c mobic I think this is causing more gastritis Continue PPI Topical voltaren gel instaed

## 2013-10-25 NOTE — Assessment & Plan Note (Signed)
Check Urine Preg I reviewed the last few months others have been normal

## 2013-10-25 NOTE — Progress Notes (Signed)
Patient ID: Lindsey Singleton, female   DOB: 03-12-1971, 43 y.o.   MRN: [id number]   Subjective:    Patient ID: Lindsey Singleton, female    DOB: 03-12-1971, 43 y.o.   MRN: [id number]  Patient presents for 3 month F/U, medication review, sore throat and chest pain  patient here with multiple concerns. She's had a sore throat on and off along with ear pain for the past 2 weeks. She's not had any fever no cough no sinus drainage. She's pain which he swallows cold items   She's also concerned about chest pain she's had episodes of chest pain on and off the past couple weeks he feels like a sharp pain with some mild nausea. She does note that when she stops taking her meloxicam the pain does go away however she restarted because she had more aching in her ankle in her hands. The pain is nonradiating but she does feel it underneath her left breast area. No diaphoresis or shortness of breath associated.  Irregular. She's concerned that her menses have been irregular she brought Melissa date starting back from last year her cycles have been anywhere from 25-31 days. Her cycle is currently late now for the month of April. Her last menstrual period was September 07, 2013 her cycles typically 3 days long  She also did not tolerate the statin drugs as a cause muscle aches and some GI upset.  Review Of Systems:  GEN- denies fatigue, fever, weight loss,weakness, recent illness HEENT- denies eye drainage, change in vision, nasal discharge, CVS- + chest pain, palpitations RESP- denies SOB, cough, wheeze ABD- denies N/V, change in stools, abd pain GU- denies dysuria, hematuria, dribbling, incontinence MSK- + joint pain, muscle aches, injury Neuro- denies headache, dizziness, syncope, seizure activity       Objective:    BP 134/72  Pulse 88  Temp(Src) 98 F (36.7 C) (Oral)  Resp 16  Ht 5' (1.524 m)  Wt 178 lb (80.74 kg)  BMI 34.76 kg/m2  LMP 09/12/2013 GEN- NAD, alert and oriented x3 HEENT- PERRL, EOMI, non  injected sclera, pink conjunctiva, MMM, oropharynx injected, nares clear, TM clear bilat Neck- Supple, shotty LAD CVS- RRR, no murmur RESP-CTAB ABD-NABS,soft,NT,ND EXT- No edema Pulses- Radial 2+   EKG-  NSR, no ST changes      Assessment & Plan:      Problem List Items Addressed This Visit   Streptococcal sore throat     amox x 10 days    Irregular menses     Check Urine Preg I reviewed the last few months others have been normal    Relevant Orders      Pregnancy, urine (Completed)   Hyperlipidemia     Will try her on zetia and see how she does    Relevant Medications      ZETIA 10 MG PO TABS   Chest pain     D/c mobic I think this is causing more gastritis Continue PPI Topical voltaren gel instaed     Other Visit Diagnoses   Sore throat    -  Primary    Relevant Orders       Rapid Strep Screen (Completed)       Note: This dictation was prepared with Dragon dictation along with smaller phrase technology. Any transcriptional errors that result from this process are unintentional.

## 2013-11-14 ENCOUNTER — Encounter: Payer: Self-pay | Admitting: Family Medicine

## 2013-11-14 ENCOUNTER — Other Ambulatory Visit: Payer: Self-pay | Admitting: Family Medicine

## 2013-11-15 NOTE — Telephone Encounter (Signed)
OK to continue to refill?

## 2013-12-26 ENCOUNTER — Ambulatory Visit (INDEPENDENT_AMBULATORY_CARE_PROVIDER_SITE_OTHER): Payer: 59 | Admitting: Family Medicine

## 2013-12-26 ENCOUNTER — Encounter: Payer: Self-pay | Admitting: Family Medicine

## 2013-12-26 VITALS — BP 128/74 | HR 64 | Temp 98.1°F | Resp 12 | Ht 60.0 in | Wt 182.0 lb

## 2013-12-26 DIAGNOSIS — M19279 Secondary osteoarthritis, unspecified ankle and foot: Secondary | ICD-10-CM

## 2013-12-26 DIAGNOSIS — L219 Seborrheic dermatitis, unspecified: Secondary | ICD-10-CM

## 2013-12-26 DIAGNOSIS — M25562 Pain in left knee: Secondary | ICD-10-CM

## 2013-12-26 DIAGNOSIS — K219 Gastro-esophageal reflux disease without esophagitis: Secondary | ICD-10-CM

## 2013-12-26 DIAGNOSIS — M19172 Post-traumatic osteoarthritis, left ankle and foot: Secondary | ICD-10-CM

## 2013-12-26 DIAGNOSIS — L218 Other seborrheic dermatitis: Secondary | ICD-10-CM

## 2013-12-26 DIAGNOSIS — M25569 Pain in unspecified knee: Secondary | ICD-10-CM

## 2013-12-26 DIAGNOSIS — M25561 Pain in right knee: Secondary | ICD-10-CM | POA: Insufficient documentation

## 2013-12-26 DIAGNOSIS — N644 Mastodynia: Secondary | ICD-10-CM

## 2013-12-26 DIAGNOSIS — M25549 Pain in joints of unspecified hand: Secondary | ICD-10-CM

## 2013-12-26 DIAGNOSIS — E785 Hyperlipidemia, unspecified: Secondary | ICD-10-CM

## 2013-12-26 DIAGNOSIS — M19079 Primary osteoarthritis, unspecified ankle and foot: Secondary | ICD-10-CM | POA: Insufficient documentation

## 2013-12-26 DIAGNOSIS — M79643 Pain in unspecified hand: Secondary | ICD-10-CM

## 2013-12-26 MED ORDER — KETOCONAZOLE 2 % EX SHAM: MEDICATED_SHAMPOO | CUTANEOUS | Status: DC

## 2013-12-26 MED ORDER — HYDROCODONE-ACETAMINOPHEN 5-325 MG PO TABS: 1.0000 | ORAL_TABLET | Freq: Four times a day (QID) | ORAL | Status: DC | PRN

## 2013-12-26 NOTE — Progress Notes (Signed)
Patient ID: Lindsey Singleton, female   DOB: 07-02-1971, 43 y.o.   MRN: [id number]   Subjective:    Patient ID: Lindsey Singleton, female    DOB: 07-02-1971, 43 y.o.   MRN: [id number]  Patient presents for 2 month F/U, Cholesterol medication side effects, R breast discomfort and wave type inflammation to head  patient here to follow chronic medical problems she has multiple concerns. 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 She is more popping and cracking of her knees and pain in her joints are worse at nighttime. She also gets some muscle tenderness into the thighs. She states she had x-rays of her knees but this was many years ago. She will be seeing orthopedics began this week. She's been taken off of oral anti-inflammatories secondary to gastritis and acid reflux though symptoms have always resolved. She is using a full tear in jail as well as tramadol but the tramadol does not help the pain as much.  Breasts pain she checked her breasts when she was on her period and felt that they were a little will be she also felt a little pea-sized nodule beneath her right nipple that she wanted checked the nodule is no longer therapist still has some soreness in both breasts.  Dermatitis of the scalp she requests a refill on this medication.    Review Of Systems:  GEN- denies fatigue, fever, weight loss,weakness, recent illness HEENT- denies eye drainage, change in vision, nasal discharge, CVS- denies chest pain, palpitations RESP- denies SOB, cough, wheeze ABD- denies N/V, change in stools, abd pain GU- denies dysuria, hematuria, dribbling, incontinence MSK- +joint pain, muscle aches, injury Neuro- denies headache, dizziness, syncope, seizure activity       Objective:    BP 128/74  Pulse 64  Temp(Src) 98.1 F (36.7 C)  (Oral)  Resp 12  Ht 5' (1.524 m)  Wt 182 lb (82.555 kg)  BMI 35.54 kg/m2 GEN- NAD, alert and oriented x3 HEENT- PERRL, EOMI, non injected sclera, pink conjunctiva, MMM, oropharynx clear Neck- Supple, no thyromegaly Breast- normal symmetry, no nipple inversion,no nipple drainage, no nodules fibrodense breast tissues Nodes- no axillary nodes CVS- RRR, no murmur RESP-CTAB MSK- bilat hands- good grasp- TTP over MIP bilat hands, normal inspection of hands no effusion, bilat knees normal inspection, no effusion, +crepitus bilat, fair ROM, liagments in tact EXT-+ left pedal edema Pulses- Radial 2+        Assessment & Plan:      Problem List Items Addressed This Visit   Seborrheic dermatitis of scalp   Hyperlipidemia   Relevant Orders      Comprehensive metabolic panel      Lipid panel   Hand joint pain - Primary   Relevant Orders      CBC with Differential      ANA      Rheumatoid factor      Sedimentation rate   Bilateral knee pain   Relevant Orders      ANA      Note: This dictation was prepared with Dragon dictation along with smaller phrase technology. Any transcriptional errors that result from this process are unintentional.

## 2013-12-26 NOTE — Assessment & Plan Note (Signed)
I think she may have some arthritis in her knees as well based on examination. She is very young for her multiple sites joint pain. I will check an ANA as well as ESR and a rheumatoid factor. I think she would benefit from reimaging of her knees this is been many years and her pain is mostly on the left side which is the side she has her chronic arthritis in her foot. She has some difficulty with translation at orthopedics because she did not have her Spanish interpreter with her. I will have my nurse send my note, so they are aware of my concerns for her knee pain and see if they can do imaging in the office

## 2013-12-26 NOTE — Assessment & Plan Note (Addendum)
Given norco to use at bedtime, continue ultram during the day Note she was also told that she has osteoporosis by her orthopedic surgeon not had any documentation of this if there is signs of early demineralization and we will need to get a bone density done

## 2013-12-26 NOTE — Assessment & Plan Note (Signed)
Exam reassuring, pt recently on menses likley hormone related, normal Mammogram 4 months ago

## 2013-12-26 NOTE — Assessment & Plan Note (Signed)
Check labs 

## 2013-12-26 NOTE — Patient Instructions (Addendum)
Take fish oil over the counter 1 capsule twice a day  Stop the zetia Use voltaren on your hands Take the hydrocodone at bedtime  Take ultram during the day  F/U 3 months

## 2013-12-26 NOTE — Assessment & Plan Note (Signed)
Well controlled 

## 2013-12-27 LAB — CBC WITH DIFFERENTIAL/PLATELET
BASOS ABS: 0.1 10*3/uL (ref 0.0–0.1)
Basophils Relative: 1 % (ref 0–1)
EOS ABS: 0.4 10*3/uL (ref 0.0–0.7)
EOS PCT: 4 % (ref 0–5)
HCT: 40 % (ref 36.0–46.0)
Hemoglobin: 13.6 g/dL (ref 12.0–15.0)
LYMPHS PCT: 34 % (ref 12–46)
Lymphs Abs: 3 10*3/uL (ref 0.7–4.0)
MCH: 28.7 pg (ref 26.0–34.0)
MCHC: 34 g/dL (ref 30.0–36.0)
MCV: 84.4 fL (ref 78.0–100.0)
Monocytes Absolute: 0.5 10*3/uL (ref 0.1–1.0)
Monocytes Relative: 6 % (ref 3–12)
Neutro Abs: 4.9 10*3/uL (ref 1.7–7.7)
Neutrophils Relative %: 55 % (ref 43–77)
PLATELETS: 310 10*3/uL (ref 150–400)
RBC: 4.74 MIL/uL (ref 3.87–5.11)
RDW: 13.8 % (ref 11.5–15.5)
WBC: 8.9 10*3/uL (ref 4.0–10.5)

## 2013-12-27 LAB — SEDIMENTATION RATE: SED RATE: 14 mm/h (ref 0–22)

## 2013-12-27 LAB — COMPREHENSIVE METABOLIC PANEL
ALBUMIN: 4.4 g/dL (ref 3.5–5.2)
ALK PHOS: 74 U/L (ref 39–117)
ALT: 32 U/L (ref 0–35)
AST: 20 U/L (ref 0–37)
BILIRUBIN TOTAL: 0.3 mg/dL (ref 0.2–1.2)
BUN: 10 mg/dL (ref 6–23)
CO2: 24 mEq/L (ref 19–32)
Calcium: 9.3 mg/dL (ref 8.4–10.5)
Chloride: 102 mEq/L (ref 96–112)
Creat: 0.61 mg/dL (ref 0.50–1.10)
Glucose, Bld: 82 mg/dL (ref 70–99)
Potassium: 4.5 mEq/L (ref 3.5–5.3)
SODIUM: 135 meq/L (ref 135–145)
TOTAL PROTEIN: 7.5 g/dL (ref 6.0–8.3)

## 2013-12-27 LAB — LIPID PANEL
CHOLESTEROL: 267 mg/dL — AB (ref 0–200)
HDL: 56 mg/dL (ref >39)
LDL CALC: 158 mg/dL — AB (ref 0–99)
Total CHOL/HDL Ratio: 4.8 Ratio
Triglycerides: 265 mg/dL — ABNORMAL HIGH (ref <150)
VLDL: 53 mg/dL — AB (ref 0–40)

## 2013-12-27 LAB — RHEUMATOID FACTOR: Rhuematoid fact SerPl-aCnc: <10 IU/mL (ref <=14)

## 2013-12-27 LAB — ANA: Anti Nuclear Antibody(ANA): NEGATIVE

## 2014-03-11 ENCOUNTER — Other Ambulatory Visit: Payer: Self-pay | Admitting: Family Medicine

## 2014-03-11 NOTE — Telephone Encounter (Signed)
Refill appropriate and filled per protocol. 

## 2014-03-29 ENCOUNTER — Encounter: Payer: Self-pay | Admitting: Family Medicine

## 2014-03-29 ENCOUNTER — Ambulatory Visit (INDEPENDENT_AMBULATORY_CARE_PROVIDER_SITE_OTHER): Payer: 59 | Admitting: Family Medicine

## 2014-03-29 VITALS — BP 130/76 | HR 68 | Temp 98.7°F | Resp 14 | Ht 60.0 in | Wt 184.0 lb

## 2014-03-29 DIAGNOSIS — Z23 Encounter for immunization: Secondary | ICD-10-CM

## 2014-03-29 DIAGNOSIS — B9689 Other specified bacterial agents as the cause of diseases classified elsewhere: Secondary | ICD-10-CM

## 2014-03-29 DIAGNOSIS — N760 Acute vaginitis: Secondary | ICD-10-CM

## 2014-03-29 DIAGNOSIS — E785 Hyperlipidemia, unspecified: Secondary | ICD-10-CM

## 2014-03-29 DIAGNOSIS — H6122 Impacted cerumen, left ear: Secondary | ICD-10-CM

## 2014-03-29 DIAGNOSIS — H6120 Impacted cerumen, unspecified ear: Secondary | ICD-10-CM

## 2014-03-29 DIAGNOSIS — A499 Bacterial infection, unspecified: Secondary | ICD-10-CM

## 2014-03-29 LAB — WET PREP FOR TRICH, YEAST, CLUE
Trich, Wet Prep: NONE SEEN
YEAST WET PREP: NONE SEEN

## 2014-03-29 MED ORDER — METRONIDAZOLE 500 MG PO TABS: 500.0000 mg | ORAL_TABLET | Freq: Two times a day (BID) | ORAL | Status: DC

## 2014-03-29 NOTE — Assessment & Plan Note (Addendum)
Start fish oil  BID Low fat diet  Blood pressure is normal today given her reassurance. She can continue to monitor at home and let us know her blood pressures dropping likely viral illness with some mild dehydration when it occurred

## 2014-03-29 NOTE — Assessment & Plan Note (Signed)
S/p irrigation, TM clear no infection

## 2014-03-29 NOTE — Progress Notes (Signed)
Patient ID: Lindsey Singleton, female   DOB: Jun 07, 1971, 43 y.o.   MRN: [id number]   Subjective:    Patient ID: Lindsey Singleton, female    DOB: [birth year], 43 y.o.   MRN: [id number]  Patient presents for 3 month F/U, Vaginal Itching, Allergic Reaction and BP Questions     Patient here to follow chronic medical problems. She is history of hyperlipidemia with high triglycerides she's unable to tolerate statins or steady at she did not realize she's been to be on fish oil and started taking yesterday.  6 weeks ago she was at the pharmacy she had a dizzy episode where she had an episode of vomiting. She took her blood pressure states it was very low. She's to monitor her blood pressure at home the low blood pressures only lasted a couple of days and is been normal the past few weeks.  She also complains of vaginal discharge and itching she felt a rash on her labia a couple weeks ago that is now resolved. Her last menstrual period was August 29 but she did have cycles in August but did not have one in July.    Review Of Systems: per above  GEN- denies fatigue, fever, weight loss,weakness, recent illness HEENT- denies eye drainage, change in vision, nasal discharge, CVS- denies chest pain, palpitations RESP- denies SOB, cough, wheeze ABD- denies N/V, change in stools, abd pain GU- denies dysuria, hematuria, dribbling, incontinence MSK- denies joint pain, muscle aches, injury Neuro- denies headache, dizziness, syncope, seizure activity       Objective:    BP 130/76  Pulse 68  Temp(Src) 98.7 F (37.1 C) (Oral)  Resp 14  Ht 5' (1.524 m)  Wt 184 lb (83.462 kg)  BMI 35.94 kg/m2  LMP 03/21/2014 GEN- NAD, alert and oriented x3 HEENT- PERRL, EOMI, non injected sclera, pink conjunctiva, MMM, oropharynx clear, Right canal clear, TM clear, left canal wax impaction - s/p irrigation canal and TM clear, nares clear Neck- Supple, noLAD CVS- RRR, no murmur RESP-CTAB GU- normal external genitalia, vaginal  mucosa pink and moist, cervix visualized no growth, no blood form os, minimal thin clear discharge, no CMT, no ovarian masses, uterus normal size Ext- no edema        Assessment & Plan:      Problem List Items Addressed This Visit   Hyperlipidemia     Start fish oil  BID Low fat diet    Cerumen impaction     S/p irrigation, TM clear no infection     Other Visit Diagnoses   Vaginitis and vulvovaginitis    -  Primary    Wet prep    Relevant Orders       WET PREP FOR TRICH, YEAST, CLUE (Completed)    BV (bacterial vaginosis)        Treat with flagyl BID for 7 days    Relevant Medications       metroNIDAZOLE (FLAGYL) tablet       Note: This dictation was prepared with Dragon dictation along with smaller phrase technology. Any transcriptional errors that result from this process are unintentional.

## 2014-03-29 NOTE — Patient Instructions (Addendum)
Take the fish oil twice a day Keep recording your blood pressure for next 2 weeks, call us with the results Flu shot given Take antibiotics as prescribed F/U 4 months- FASTING for repeat labs

## 2014-03-29 NOTE — Addendum Note (Signed)
Addended by: Phillips Odor on: 03/29/2014 05:26 PM   Modules accepted: Orders

## 2014-04-09 ENCOUNTER — Other Ambulatory Visit: Payer: Self-pay | Admitting: Family Medicine

## 2014-04-11 NOTE — Telephone Encounter (Signed)
Medication refilled per protocol. 

## 2014-05-08 ENCOUNTER — Encounter: Payer: Self-pay | Admitting: Family Medicine

## 2014-05-09 ENCOUNTER — Ambulatory Visit (INDEPENDENT_AMBULATORY_CARE_PROVIDER_SITE_OTHER): Payer: 59 | Admitting: Family Medicine

## 2014-05-09 ENCOUNTER — Encounter: Payer: Self-pay | Admitting: Family Medicine

## 2014-05-09 VITALS — BP 126/72 | HR 86 | Temp 98.6°F | Resp 14 | Ht 60.0 in | Wt 181.0 lb

## 2014-05-09 DIAGNOSIS — J020 Streptococcal pharyngitis: Secondary | ICD-10-CM

## 2014-05-09 DIAGNOSIS — J029 Acute pharyngitis, unspecified: Secondary | ICD-10-CM

## 2014-05-09 LAB — RAPID STREP SCREEN (MED CTR MEBANE ONLY): Streptococcus, Group A Screen (Direct): POSITIVE — AB

## 2014-05-09 NOTE — Progress Notes (Signed)
Patient ID: Lindsey Singleton, female   DOB: 07/20/1970, 43 y.o.   MRN: [id number]   Subjective:    Patient ID: Lindsey Singleton, female    DOB: 07/20/1970, 43 y.o.   MRN: [id number]  Patient presents for Illness  Patient here with sore throat for the past 3 days no significant fever she had minimal cough last week she's also had some headache. She does have history of strep throat in the past. No known sick contacts. She did try taking some type of over-the-counter medication which I believe it's the same when she tried a few months ago which she had a rash to but she still does not know the name of this.   Review Of Systems:  GEN- denies fatigue, fever, weight loss,weakness, recent illness HEENT- denies eye drainage, change in vision, nasal discharge, CVS- denies chest pain, palpitations RESP- denies SOB, +cough, wheeze ABD- + N/ no V, change in stools, abd pain Neuro- + headache,denies  dizziness, syncope, seizure activity       Objective:    BP 126/72 mmHg  Pulse 86  Temp(Src) 98.6 F (37 C) (Oral)  Resp 14  Ht 5' (1.524 m)  Wt 181 lb (82.101 kg)  BMI 35.35 kg/m2 GEN- NAD, alert and oriented x3 HEENT- PERRL, EOMI, non injected sclera, pink conjunctiva, MMM, oropharynx+ injection, no exudates  TM clear bilat no effusion,  mild maxillary sinus tenderness, inflammed turbinates,  Nasal drainage  Neck- Supple,shotty  LAD CVS- RRR, no murmur RESP-CTAB EXT- No edema Pulses- Radial 2+         Assessment & Plan:      Problem List Items Addressed This Visit    None    Visit Diagnoses    Sore throat    -  Primary    Relevant Orders       Rapid Strep Screen (Completed)    Streptococcal sore throat        positive strep test will treat her with Augmentin for the next 10 days saltwater gargle, acetaminophen for pain       Note: This dictation was prepared with Dragon dictation along with smaller phrase technology. Any transcriptional errors that result from this process are  unintentional.

## 2014-05-24 ENCOUNTER — Ambulatory Visit (INDEPENDENT_AMBULATORY_CARE_PROVIDER_SITE_OTHER): Payer: 59 | Admitting: Physician Assistant

## 2014-05-24 ENCOUNTER — Encounter: Payer: Self-pay | Admitting: Physician Assistant

## 2014-05-24 VITALS — BP 126/74 | HR 84 | Temp 98.3°F | Resp 18 | Wt 181.0 lb

## 2014-05-24 DIAGNOSIS — B9689 Other specified bacterial agents as the cause of diseases classified elsewhere: Secondary | ICD-10-CM

## 2014-05-24 DIAGNOSIS — A499 Bacterial infection, unspecified: Secondary | ICD-10-CM

## 2014-05-24 DIAGNOSIS — M25572 Pain in left ankle and joints of left foot: Secondary | ICD-10-CM

## 2014-05-24 DIAGNOSIS — L218 Other seborrheic dermatitis: Secondary | ICD-10-CM

## 2014-05-24 DIAGNOSIS — M810 Age-related osteoporosis without current pathological fracture: Secondary | ICD-10-CM | POA: Insufficient documentation

## 2014-05-24 DIAGNOSIS — K219 Gastro-esophageal reflux disease without esophagitis: Secondary | ICD-10-CM

## 2014-05-24 DIAGNOSIS — B373 Candidiasis of vulva and vagina: Secondary | ICD-10-CM

## 2014-05-24 DIAGNOSIS — B3731 Acute candidiasis of vulva and vagina: Secondary | ICD-10-CM

## 2014-05-24 DIAGNOSIS — N760 Acute vaginitis: Secondary | ICD-10-CM

## 2014-05-24 DIAGNOSIS — L219 Seborrheic dermatitis, unspecified: Secondary | ICD-10-CM

## 2014-05-24 LAB — WET PREP FOR TRICH, YEAST, CLUE: Trich, Wet Prep: NONE SEEN

## 2014-05-24 MED ORDER — FLUCONAZOLE 150 MG PO TABS: 150.0000 mg | ORAL_TABLET | Freq: Once | ORAL | Status: DC

## 2014-05-24 MED ORDER — KETOCONAZOLE 2 % EX SHAM
MEDICATED_SHAMPOO | CUTANEOUS | Status: DC
Start: 2014-05-24 — End: 2014-07-03

## 2014-05-24 MED ORDER — OMEPRAZOLE 40 MG PO CPDR: 40.0000 mg | DELAYED_RELEASE_CAPSULE | Freq: Every day | ORAL | Status: DC

## 2014-05-24 MED ORDER — HYDROCODONE-ACETAMINOPHEN 5-325 MG PO TABS: 1.0000 | ORAL_TABLET | Freq: Four times a day (QID) | ORAL | Status: DC | PRN

## 2014-05-24 MED ORDER — METRONIDAZOLE 500 MG PO TABS: 500.0000 mg | ORAL_TABLET | Freq: Two times a day (BID) | ORAL | Status: DC

## 2014-05-25 LAB — GC/CHLAMYDIA PROBE AMP
CT PROBE, AMP APTIMA: NEGATIVE
GC Probe RNA: NEGATIVE

## 2014-05-25 NOTE — Progress Notes (Signed)
Patient ID: Lindsey FlockLuz Singleton MRN: [id number], DOB: 12-31-1970, 43 y.o. Date of Encounter: @DATE @  Chief Complaint:  Chief Complaint  Patient presents with  . bad vaginal inf w/ discharge    x 8 days, refill medicated shampoo and omeprazole and hydorcodone, foot Dr told her she has osteoporsis    HPI: 43 y.o. year old female  presents with Spanish Interpreter. He is here to address the above issues.  Asked her what type of pain she uses the hydrocodone for and she reports that she uses it for different arthritis pains in her hands and her left foot.  Says that she has had 3 surgeries to the left foot back in 2010 she had 2 surgeries and then she had another surgery 2 or 3 years ago. She has been seeing Dr. Ethelene Halamos regarding her left foot. Says her last appointment with him was in October and her next appointment there is december 28. She has crutches with her and I asked her about this and she says that they are to use because otherwise she has pain with weightbearing. She says that she has been using the hydrocodone only when she can't stand the pain and when the tramadol does not control the pain. Only uses about 1 or 2 per month. I reviewed her record it does appear that the last prescription was 12/26/13.  Also I reviewed her chart and saw that she saw Dr. Jeanice Limurham here on 12/26/13. She reported pain and stiffness in both hands as well as both knees and known chronic arthritis of the left foot which was being treated by orthopedics. At that visit Dr. Jeanice Limurham checked labs. This included a CBC, CMP T, ANA, rheumatoid factor and sedimentation rate. All of these were normal.   Past Medical History  Diagnosis Date  . Asthma   . Chronic kidney disease     recurrent pyelo  . Gastritis     no current medication  . Hyperlipidemia     no medication  . Herpes simplex type 2 infection 09/12/2013     Home Meds: Outpatient Prescriptions Prior to Visit  Medication Sig Dispense Refill  . diclofenac  sodium (VOLTAREN) 1 % GEL Apply to affected areas TID 1 Tube 3  . traMADol (ULTRAM) 50 MG tablet Take 50 mg by mouth every 6 (six) hours as needed.    . valACYclovir (VALTREX) 500 MG tablet Take 1 tablet (500 mg total) by mouth daily. 30 tablet 11  . omeprazole (PRILOSEC) 40 MG capsule TAKE 1 CAPSULE BY MOUTH DAILY. 30 capsule 3  . Omega-3 Fatty Acids (FISH OIL) 1200 MG CAPS Take 1 capsule by mouth 2 (two) times daily.    Marland Kitchen. HYDROcodone-acetaminophen (NORCO) 5-325 MG per tablet Take 1 tablet by mouth every 6 (six) hours as needed for moderate pain. 45 tablet 0  . ketoconazole (NIZORAL) 2 % shampoo APPLY 1 APPLICATION TOPICALLY 2 (TWO) TIMES A WEEK. FOR 8 WEEKS 120 mL 2   No facility-administered medications prior to visit.    Allergies:  Allergies  Allergen Reactions  . Cephalexin Hives and Itching  . Simvastatin Other (See Comments)    Bone pain, dry mouth  . Gabapentin   . Pravastatin Swelling    Facial swelling and stomach pains  . Zetia [Ezetimibe]     Bone Pain    History   Social History  . Marital Status: Married    Spouse Name: N/A    Number of Children: N/A  . Years of Education:  N/A   Occupational History  . Not on file.   Social History Main Topics  . Smoking status: Never Smoker   . Smokeless tobacco: Never Used  . Alcohol Use: No  . Drug Use: No  . Sexual Activity: No   Other Topics Concern  . Not on file   Social History Narrative    Family History  Problem Relation Age of Onset  . Heart disease Father   . Cancer Brother     leukemia  . Diabetes Maternal Aunt   . Diabetes Maternal Uncle      Review of Systems:  See HPI for pertinent ROS. All other ROS negative.    Physical Exam: Blood pressure 126/74, pulse 84, temperature 98.3 F (36.8 C), temperature source Oral, resp. rate 18, weight 181 lb (82.101 kg)., Body mass index is 35.35 kg/(m^2). General: Obese Hispanic Female. Appears in no acute distress. Lungs: Clear bilaterally to  auscultation without wheezes, rales, or rhonchi. Breathing is unlabored. Heart: RRR with S1 S2. No murmurs, rubs, or gallops. Abdomen: Soft, non-tender, non-distended with normoactive bowel sounds. No hepatomegaly. No rebound/guarding. No obvious abdominal masses. Pelvic exam: External genitalia normal. Vaginal mucosa normal. I see no lesions and no signs of herpes etc. There is moderate to large amount of vaginal discharge present. Bimanual exam is normal with no cervical motion tenderness. Musculoskeletal:  Strength and tone normal for age. Extremities/Skin: Warm and dry. Neuro: Alert and oriented X 3. Moves all extremities spontaneously. Gait is normal. CNII-XII grossly in tact. Psych:  Responds to questions appropriately with a normal affect.   Results for orders placed or performed in visit on 05/24/14  WET PREP FOR TRICH, YEAST, CLUE  Result Value Ref Range   Yeast Wet Prep HPF POC FEW (A) NONE SEEN   Trich, Wet Prep NONE SEEN NONE SEEN   Clue Cells Wet Prep HPF POC MOD (A) NONE SEEN   WBC, Wet Prep HPF POC FEW NONE SEEN     ASSESSMENT AND PLAN:  43 y.o. year old female with  1. BV (bacterial vaginosis) - metroNIDAZOLE (FLAGYL) 500 MG tablet; Take 1 tablet (500 mg total) by mouth 2 (two) times daily.  Dispense: 14 tablet; Refill: 0  2. Vaginal candidiasis - fluconazole (DIFLUCAN) 150 MG tablet; Take 1 tablet (150 mg total) by mouth once.  Dispense: 1 tablet; Refill: 0  3. Vaginitis and vulvovaginitis - GC/Chlamydia Probe Amp - WET PREP FOR TRICH, YEAST, CLUE  4. Osteoporosis - DG Bone Density; Future  5. Pain in left ankle - HYDROcodone-acetaminophen (NORCO) 5-325 MG per tablet; Take 1 tablet by mouth every 6 (six) hours as needed for severe pain.  Dispense: 30 tablet; Refill: 0  6. Seborrheic dermatitis of scalp - ketoconazole (NIZORAL) 2 % shampoo; APPLY 1 APPLICATION TOPICALLY 2 (TWO) TIMES A WEEK. FOR 8 WEEKS  Dispense: 120 mL; Refill: 2  7. Gastroesophageal reflux  disease, esophagitis presence not specified - omeprazole (PRILOSEC) 40 MG capsule; Take 1 capsule (40 mg total) by mouth daily.  Dispense: 30 capsule; Refill: [id number]   Signed, Mary Beth Enc[place], [place], [facility] 05/25/2014 7:06 AM

## 2014-05-29 ENCOUNTER — Encounter: Payer: Self-pay | Admitting: *Deleted

## 2014-06-12 ENCOUNTER — Other Ambulatory Visit: Payer: Self-pay | Admitting: Physician Assistant

## 2014-06-12 ENCOUNTER — Ambulatory Visit
Admission: RE | Admit: 2014-06-12 | Discharge: 2014-06-12 | Disposition: A | Discharge status: Home / Self Care | Payer: 59 | Source: Ambulatory Visit | Attending: Physician Assistant | Admitting: Physician Assistant

## 2014-06-12 DIAGNOSIS — M810 Age-related osteoporosis without current pathological fracture: Secondary | ICD-10-CM

## 2014-06-12 DIAGNOSIS — E559 Vitamin D deficiency, unspecified: Secondary | ICD-10-CM

## 2014-06-21 ENCOUNTER — Encounter: Payer: Self-pay | Admitting: Family Medicine

## 2014-07-03 ENCOUNTER — Ambulatory Visit (INDEPENDENT_AMBULATORY_CARE_PROVIDER_SITE_OTHER): Payer: 59 | Admitting: Family Medicine

## 2014-07-03 ENCOUNTER — Ambulatory Visit
Admission: RE | Admit: 2014-07-03 | Discharge: 2014-07-03 | Disposition: A | Discharge status: Home / Self Care | Payer: 59 | Source: Ambulatory Visit | Attending: Family Medicine | Admitting: Family Medicine

## 2014-07-03 ENCOUNTER — Encounter: Payer: Self-pay | Admitting: Family Medicine

## 2014-07-03 VITALS — BP 108/70 | HR 76 | Temp 98.0°F | Resp 18 | Ht 60.0 in | Wt 180.0 lb

## 2014-07-03 DIAGNOSIS — R1013 Epigastric pain: Secondary | ICD-10-CM

## 2014-07-03 LAB — CBC W/MCH & 3 PART DIFF
HEMATOCRIT: 41.6 % (ref 36.0–46.0)
Hemoglobin: 14 g/dL (ref 12.0–15.0)
Lymphocytes Relative: 30 % (ref 12–46)
Lymphs Abs: 2.5 10*3/uL (ref 0.7–4.0)
MCH: 29.1 pg (ref 26.0–34.0)
MCHC: 33.7 g/dL (ref 30.0–36.0)
MCV: 86.5 fL (ref 78.0–100.0)
Neutro Abs: 5 10*3/uL (ref 1.7–7.7)
Neutrophils Relative %: 60 % (ref 43–77)
Platelets: 268 10*3/uL (ref 150–400)
RBC: 4.81 MIL/uL (ref 3.87–5.11)
RDW: 14.1 % (ref 11.5–15.5)
WBC mixed population %: 10 % (ref 3–18)
WBC: 8.3 10*3/uL (ref 4.0–10.5)
WBCMIX: 0.9 10*3/uL (ref 0.1–1.8)

## 2014-07-03 MED ORDER — PANTOPRAZOLE SODIUM 40 MG PO TBEC: 40.0000 mg | DELAYED_RELEASE_TABLET | Freq: Two times a day (BID) | ORAL | Status: DC

## 2014-07-03 MED ORDER — SUCRALFATE 1 G PO TABS: 1.0000 g | ORAL_TABLET | Freq: Three times a day (TID) | ORAL | Status: DC

## 2014-07-03 NOTE — Progress Notes (Signed)
Subjective:    Patient ID: Lindsey Singleton, female    DOB: [birth year], 43 y.o.   MRN: [id number]  HPI Patient is a significant past medical history of gastritis.. Over the last several weeks she has developed epigastric abdominal discomfort. The pain is located underneath her xiphoid process. It radiates up into her throat. It is burning area that is sharp and severe. She is tender to palpation underneath the xiphoid process in the left upper quadrant. It is exacerbated by food. She denies any shortness of breath. She denies any chest pain. She also has some mild tenderness to palpation in the right upper quadrant. She still has her gallbladder. She denies any fevers. She denies any chills. She denies any nausea or vomiting. She does report bloating and belching. She also reports constipation. She denies any melena or hematochezia. Past Medical History  Diagnosis Date  . Asthma   . Chronic kidney disease     recurrent pyelo  . Gastritis     no current medication  . Hyperlipidemia     no medication  . 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 Open reduction malar fracture    . Appendectomy    . Cesarean section  1992, 2005   Current Outpatient Prescriptions on File Prior to Visit  Medication Sig Dispense Refill  . omeprazole (PRILOSEC) 40 MG capsule Take 1 capsule (40 mg total) by mouth daily. 30 capsule 11  . traMADol (ULTRAM) 50 MG tablet Take 50 mg by mouth every 6 (six) hours as needed.    . valACYclovir (VALTREX) 500 MG tablet Take 1 tablet (500 mg total) by mouth daily. 30 tablet 11  . Omega-3 Fatty Acids (FISH OIL) 1200 MG CAPS Take 1 capsule by mouth 2 (two) times daily.     No current facility-administered medications on file prior to visit.   Allergies  Allergen Reactions  . Cephalexin Hives and Itching  . Simvastatin Other (See Comments)    Bone pain, dry mouth  . Gabapentin   . Pravastatin Swelling    Facial  swelling and stomach pains  . Zetia [Ezetimibe]     Bone Pain   History   Social History  . Marital Status: Married    Spouse Name: N/A    Number of Children: N/A  . Years of Education: N/A   Occupational History  . Not on file.   Social History Main Topics  . Smoking status: Never Smoker   . Smokeless tobacco: Never Used  . Alcohol Use: No  . Drug Use: No  . Sexual Activity: No   Other Topics Concern  . Not on file   Social History Narrative   Family History  Problem Relation Age of Onset  . Heart disease Father   . Cancer Brother     leukemia  . Diabetes Maternal Aunt   . Diabetes Maternal Uncle       Review of Systems  All other systems reviewed and are negative.      Objective:   Physical Exam  Constitutional: She appears well-developed and well-nourished. She appears distressed.  Neck: Neck supple.  Cardiovascular: Normal rate, regular rhythm and normal heart sounds.   No murmur heard. Pulmonary/Chest: Effort normal and breath sounds normal. No respiratory distress. She has no wheezes. She has no rales.  Abdominal: Soft. Bowel sounds are normal. She exhibits no distension. There is tenderness. There is guarding. There is no rebound.  Assessment & Plan:  Abdominal pain, epigastric - Plan: COMPLETE METABOLIC PANEL WITH GFR, Helicobacter pylori abs-IgG+IgA, bld, Lipase, CBC w/MCH & 3 Part Diff, sucralfate (CARAFATE) 1 G tablet, pantoprazole (PROTONIX) 40 MG tablet, DG Abd 2 Views  She has voluntary guarding and appears to be in mild-to-moderate discomfort.  The differential diagnosis includes peptic ulcer disease, gastritis, pancreatitis, atypical cholelithiasis regarding location. I will begin with a CBC, lipase, H. pylori. I will also check x-ray to rule out bowel obstruction.  Begin Carafate 1 g by mouth every before meals at bedtime and Protonix 40 mg by mouth twice a day for empiric therapy for possible ulcer. If there is an elevated lipase  or evidence of choledocholithiasis, I will change my plan based on the results of the blood work.

## 2014-07-04 LAB — HELICOBACTER PYLORI ABS-IGG+IGA, BLD
H Pylori IgG: <0.4 {ISR}
HELICOBACTER PYLORI AB, IGA: 8.7 U/mL (ref <9.0)

## 2014-07-04 LAB — COMPLETE METABOLIC PANEL WITH GFR
ALBUMIN: 4.2 g/dL (ref 3.5–5.2)
ALT: 36 U/L — ABNORMAL HIGH (ref 0–35)
AST: 21 U/L (ref 0–37)
Alkaline Phosphatase: 71 U/L (ref 39–117)
BUN: 13 mg/dL (ref 6–23)
CALCIUM: 9.1 mg/dL (ref 8.4–10.5)
CHLORIDE: 102 meq/L (ref 96–112)
CO2: 24 meq/L (ref 19–32)
CREATININE: 0.52 mg/dL (ref 0.50–1.10)
GLUCOSE: 88 mg/dL (ref 70–99)
POTASSIUM: 4.4 meq/L (ref 3.5–5.3)
SODIUM: 135 meq/L (ref 135–145)
Total Bilirubin: 0.3 mg/dL (ref 0.2–1.2)
Total Protein: 7.5 g/dL (ref 6.0–8.3)

## 2014-07-04 LAB — LIPASE: Lipase: 19 U/L (ref 0–75)

## 2014-07-05 ENCOUNTER — Encounter: Payer: Self-pay | Admitting: Family Medicine

## 2014-07-11 ENCOUNTER — Encounter: Payer: Self-pay | Admitting: Family Medicine

## 2014-07-11 ENCOUNTER — Ambulatory Visit (INDEPENDENT_AMBULATORY_CARE_PROVIDER_SITE_OTHER): Payer: Medicaid Other | Admitting: Family Medicine

## 2014-07-11 VITALS — BP 118/74 | HR 88 | Temp 99.0°F | Resp 16 | Ht 60.0 in | Wt 181.0 lb

## 2014-07-11 DIAGNOSIS — K219 Gastro-esophageal reflux disease without esophagitis: Secondary | ICD-10-CM

## 2014-07-11 DIAGNOSIS — L218 Other seborrheic dermatitis: Secondary | ICD-10-CM

## 2014-07-11 DIAGNOSIS — R1013 Epigastric pain: Secondary | ICD-10-CM

## 2014-07-11 DIAGNOSIS — L219 Seborrheic dermatitis, unspecified: Secondary | ICD-10-CM

## 2014-07-11 MED ORDER — CLOBETASOL PROPIONATE 0.05 % EX SOLN: 1.0000 "application " | Freq: Two times a day (BID) | CUTANEOUS | Status: DC | PRN

## 2014-07-11 NOTE — Progress Notes (Signed)
Patient ID: Lindsey FlockLuz Buffkin, female   DOB: 05-07-1971, 44 y.o.   MRN: [id number]   Subjective:    Patient ID: Lindsey FlockLuz Curbow, female    DOB: 05-07-1971, 44 y.o.   MRN: [id number]  Patient presents for F/U patient here for follow-up. She was seen last week with epigastric pain which was worse with eating. She also had some mild pain in her lower abdomen. [facility] as well as Protonix 40 mg twice a day. X-ray was done which was unremarkable. Hepatitis helped the heartburn symptoms that she was having however she continues to have pain with eating. Her labs were unremarkable and her H. Pylori serology was negative. She does have a history of chronic NSAID use which I discontinued back in January of last year she had been on anti-inflammatories secondary to chronic ankle pain for about 5 years.  She also continues to get sore spots and swelling in her scalp the Nizoral shampoo helped the itching and flakiness.  Review Of Systems:  GEN- denies fatigue, fever, weight loss,weakness, recent illness HEENT- denies eye drainage, change in vision, nasal discharge, CVS- denies chest pain, palpitations RESP- denies SOB, cough, wheeze ABD- denies N/V, change in stools,+ abd pain Neuro- denies headache, dizziness, syncope, seizure activity       Objective:    BP 118/74 mmHg  Pulse 88  Temp(Src) 99 F (37.2 C) (Oral)  Resp 16  Ht 5' (1.524 m)  Wt 181 lb (82.101 kg)  BMI 35.35 kg/m2  LMP 06/25/2014 (Exact Date) GEN- NAD, alert and oriented x3, non toxic appearing HEENT- PERRL, EOMI, non injected sclera, pink conjunctiva, MMM, oropharynx clear CVS- RRR, no murmur RESP-CTAB ABD-NABS,soft,ttp RUQ and epigastric, mild TTP LLQ, no rebound, no guarding Pulses- Radial 2+ Scalp- frontgal scalp with wave pattern, small areas of swelling and redness        Assessment & Plan:      Problem List Items Addressed This Visit      Unprioritized   Seborrheic dermatitis of scalp - Primary     nizoral shampoo, add topical clobetasol to sore areas, if no improvement refer to derm    GERD (gastroesophageal reflux disease)    Continue protonix     Other Visit Diagnoses    Abdominal pain, epigastric         PUD vs gallbladder etiology, Ultrasound to be ordered, referral to GI, long standing NSAID history of US neg , needs EGD       Note: This dictation was prepared with Dragon dictation along with smaller phrase technology. Any transcriptional errors that result from this process are unintentional.

## 2014-07-11 NOTE — Assessment & Plan Note (Signed)
Continue protonix  

## 2014-07-11 NOTE — Assessment & Plan Note (Signed)
nizoral shampoo, add topical clobetasol to sore areas, if no improvement refer to derm

## 2014-07-11 NOTE — Patient Instructions (Signed)
Continue current medications Referral to GI for endoscopy Ultraound of gallbladder to be set up  Use topical steroid on scalp twice a day  F/U pending results

## 2014-07-12 ENCOUNTER — Encounter: Payer: Self-pay | Admitting: *Deleted

## 2014-07-17 ENCOUNTER — Ambulatory Visit (INDEPENDENT_AMBULATORY_CARE_PROVIDER_SITE_OTHER): Payer: 59 | Admitting: Internal Medicine

## 2014-07-17 ENCOUNTER — Encounter: Payer: Self-pay | Admitting: Internal Medicine

## 2014-07-17 VITALS — BP 100/64 | HR 88 | Ht 60.0 in | Wt 179.2 lb

## 2014-07-17 DIAGNOSIS — R1013 Epigastric pain: Secondary | ICD-10-CM

## 2014-07-17 DIAGNOSIS — G8929 Other chronic pain: Secondary | ICD-10-CM | POA: Diagnosis not present

## 2014-07-17 MED ORDER — DICYCLOMINE HCL 20 MG PO TABS: 20.0000 mg | ORAL_TABLET | Freq: Three times a day (TID) | ORAL | Status: DC

## 2014-07-17 NOTE — Progress Notes (Signed)
   Subjective:    Patient ID: Lindsey Singleton, female    DOB: 1971/03/02, 44 y.o.   MRN: [id number]  HPI  This middle-aged [place] woman is here with her husband, son and an interpreter. She has years of pain. Location is epigastric, chest burning. Eating makes it worse and so does drinking water May keep her awake 1 time she will vomit when the pain becomes intense.  Last Rx helps a little think that was Carafate.  Meloxicam x 6 yrs for LE pain and knee problems.  Constipation x years since first pregnancy - more so loose now Medications, allergies, past medical history, past surgical history, family history and social history are reviewed and updated in the EMR.   Review of Systems Currently has problems with chronic knee pain, is on crutches. There may been some sort of injury is not entirely clear to me. She was seeing an orthopedist who recently declined to schedule follow-up, reportedly some difficulty with the insurance company or perhaps even [employer]. All other review of systems negative or as per history of present illness.    Objective:   Physical Exam General:  Well-developed, well-nourished and in no acute distress - obese Eyes:  anicteric. ENT:   Mouth and posterior pharynx free of lesions.  Neck:   supple w/o thyromegaly or mass.  Lungs: Clear to auscultation bilaterally. Heart:  S1S2, no rubs, murmurs, gallops. Abdomen:  soft,  diffuselytender, no hepatosplenomegaly, hernia, or mass and BS+. Benign overall but tender to vene light touch, pain better with muscle tension Lymph:  no cervical or supraclavicular adenopathy. Extremities:   no edema Skin   no rash. Neuro:  A&O x 3.  Psych:  appropriate mood and  Affect.   Data Reviewed: Labs, primary care notes in the computer. CBC LFTs are normal as is lipase.    Assessment & Plan:   1. Abdominal pain, chronic, epigastric     1. Agree with ultrasound no probably will be unrevealing I think is worth  checking. 2. EGD will be checked for the same reasons. Endoscopy risks will be reviewed with the patient. 3. Trial of dicyclomine 20 mg before meals and at bedtime. 4. 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 Consider something like a tricyclic agent depending upon her diagnostic workup and response to other therapy though she does not appear to have responded well to PPI or Carafate both of which were reasonable therapies.

## 2014-07-17 NOTE — Patient Instructions (Addendum)
You have been scheduled for an endoscopy. Please follow written instructions given to you at your visit today. If you use inhalers (even only as needed), please bring them with you on the day of your procedure.  We have sent the following medications to your pharmacy for you to pick up at your convenience: dicyclomine   I appreciate the opportunity to care for you. Stan Headarl Gessner, M.D., [facility]

## 2014-07-18 ENCOUNTER — Ambulatory Visit
Admission: RE | Admit: 2014-07-18 | Discharge: 2014-07-18 | Disposition: A | Discharge status: Home / Self Care | Payer: Medicaid Other | Source: Ambulatory Visit | Attending: Family Medicine | Admitting: Family Medicine

## 2014-07-18 DIAGNOSIS — R1013 Epigastric pain: Secondary | ICD-10-CM

## 2014-07-18 DIAGNOSIS — K219 Gastro-esophageal reflux disease without esophagitis: Secondary | ICD-10-CM

## 2014-07-31 ENCOUNTER — Encounter: Payer: Self-pay | Admitting: Family Medicine

## 2014-07-31 ENCOUNTER — Ambulatory Visit (INDEPENDENT_AMBULATORY_CARE_PROVIDER_SITE_OTHER): Payer: 59 | Admitting: Family Medicine

## 2014-07-31 VITALS — BP 128/72 | HR 72 | Temp 98.2°F | Resp 14 | Ht 60.0 in | Wt 179.0 lb

## 2014-07-31 DIAGNOSIS — K219 Gastro-esophageal reflux disease without esophagitis: Secondary | ICD-10-CM | POA: Diagnosis not present

## 2014-07-31 DIAGNOSIS — N760 Acute vaginitis: Secondary | ICD-10-CM | POA: Diagnosis not present

## 2014-07-31 DIAGNOSIS — E669 Obesity, unspecified: Secondary | ICD-10-CM | POA: Diagnosis not present

## 2014-07-31 DIAGNOSIS — E785 Hyperlipidemia, unspecified: Secondary | ICD-10-CM

## 2014-07-31 DIAGNOSIS — M19172 Post-traumatic osteoarthritis, left ankle and foot: Secondary | ICD-10-CM | POA: Diagnosis not present

## 2014-07-31 MED ORDER — TRAMADOL HCL 50 MG PO TABS: 50.0000 mg | ORAL_TABLET | Freq: Four times a day (QID) | ORAL | Status: DC | PRN

## 2014-07-31 MED ORDER — NYSTATIN 100000 UNIT/GM EX CREA: 1.0000 "application " | TOPICAL_CREAM | Freq: Two times a day (BID) | CUTANEOUS | Status: DC

## 2014-07-31 NOTE — Progress Notes (Signed)
Patient ID: Lindsey FlockLuz Singleton, female   DOB: Oct 22, 1970, 44 y.o.   MRN: [id number]   Subjective:    Patient ID: Lindsey FlockLuz Singleton, female    DOB: Oct 22, 1970, 44 y.o.   MRN: [id number]  Patient presents for 4 month F/U  Issue here to follow-up chronic medical problems. She was seen a few weeks ago secondary to ongoing epigastric pain she was seen by gastroenterology she hasn't endoscopy scheduled for next week to rule out ulcer/hiatal hernia. She has done okay with the Carafate but continues to have pain as well as a lot of bloating and gas. Ultrasound did not reveal any gallstones but she does have fatty liver. Unfortunately she has not been able tolerate any of the cholesterol medications therefore we have been trying to work on her diet  She also asked about vaginal infections she gets an itching in the vaginal area she's also been treated for bacterial vaginosis, as she does take follow back as frequently and uses body wash in the vaginal area. She does not have any current vaginal discharge and no recent bleeding.   Review Of Systems:  GEN- denies fatigue, fever, weight loss,weakness, recent illness HEENT- denies eye drainage, change in vision, nasal discharge, CVS- denies chest pain, palpitations RESP- denies SOB, cough, wheeze ABD- denies N/V, change in stools, +abd pain GU- denies dysuria, hematuria, dribbling, incontinence MSK- denies joint pain, muscle aches, injury Neuro- denies headache, dizziness, syncope, seizure activity       Objective:    BP 128/72 mmHg  Pulse 72  Temp(Src) 98.2 F (36.8 C) (Oral)  Resp 14  Ht 5' (1.524 m)  Wt 179 lb (81.194 kg)  BMI 34.96 kg/m2  LMP 07/23/2014 GEN- NAD, alert and oriented x3 HEENT- PERRL, EOMI, non injected sclera, pink conjunctiva, MMM, oropharynx clear CVS- RRR, no murmur RESP-CTAB ABD-NABS,soft,ttp RUQ and epigastric,  no rebound, no guarding EXT- No edema Pulses- Radial 2+        Assessment & Plan:      Problem List Items  Addressed This Visit      Unprioritized   Obesity   OA (osteoarthritis) of foot    Continue ultram      Hyperlipidemia - Primary    Given handout on dietary changes in spainish      Relevant Orders   Comprehensive metabolic panel   Lipid panel   GERD (gastroesophageal reflux disease)    Continue carafate and bentyl         Note: This dictation was prepared with Dragon dictation along with smaller phrase technology. Any transcriptional errors that result from this process are unintentional.

## 2014-07-31 NOTE — Assessment & Plan Note (Signed)
Continue ultram  

## 2014-07-31 NOTE — Assessment & Plan Note (Signed)
Continue carafate and bentyl

## 2014-07-31 NOTE — Assessment & Plan Note (Signed)
Given handout on dietary changes in spainish

## 2014-07-31 NOTE — Assessment & Plan Note (Signed)
Will give topical nystatin cream for external use   Note, also switch to OTC vitamin D

## 2014-07-31 NOTE — Patient Instructions (Addendum)
Take Vitamin D2  1000IU  We will call with cholesterol labs Nystatin cream to vaginal area  F/U 4 months

## 2014-08-01 ENCOUNTER — Other Ambulatory Visit: Payer: Self-pay | Admitting: *Deleted

## 2014-08-01 LAB — COMPREHENSIVE METABOLIC PANEL
ALK PHOS: 67 U/L (ref 39–117)
ALT: 26 U/L (ref 0–35)
AST: 15 U/L (ref 0–37)
Albumin: 4.4 g/dL (ref 3.5–5.2)
BUN: 11 mg/dL (ref 6–23)
CALCIUM: 9.3 mg/dL (ref 8.4–10.5)
CO2: 25 mEq/L (ref 19–32)
CREATININE: 0.53 mg/dL (ref 0.50–1.10)
Chloride: 103 mEq/L (ref 96–112)
GLUCOSE: 71 mg/dL (ref 70–99)
Potassium: 4 mEq/L (ref 3.5–5.3)
Sodium: 138 mEq/L (ref 135–145)
TOTAL PROTEIN: 7.4 g/dL (ref 6.0–8.3)
Total Bilirubin: 0.4 mg/dL (ref 0.2–1.2)

## 2014-08-01 LAB — LIPID PANEL
Cholesterol: 286 mg/dL — ABNORMAL HIGH (ref 0–200)
HDL: 48 mg/dL (ref >39)
LDL Cholesterol: 201 mg/dL — ABNORMAL HIGH (ref 0–99)
Total CHOL/HDL Ratio: 6 Ratio
Triglycerides: 185 mg/dL — ABNORMAL HIGH (ref <150)
VLDL: 37 mg/dL (ref 0–40)

## 2014-08-01 MED ORDER — NIACIN ER (ANTIHYPERLIPIDEMIC) 500 MG PO TBCR: 500.0000 mg | EXTENDED_RELEASE_TABLET | Freq: Every day | ORAL | Status: DC

## 2014-08-07 ENCOUNTER — Encounter: Payer: Self-pay | Admitting: Internal Medicine

## 2014-08-07 ENCOUNTER — Ambulatory Visit (AMBULATORY_SURGERY_CENTER): Payer: 59 | Admitting: Internal Medicine

## 2014-08-07 VITALS — BP 118/59 | HR 62 | Temp 98.1°F | Resp 12 | Ht 60.0 in | Wt 179.0 lb

## 2014-08-07 DIAGNOSIS — K2950 Unspecified chronic gastritis without bleeding: Secondary | ICD-10-CM

## 2014-08-07 DIAGNOSIS — K317 Polyp of stomach and duodenum: Secondary | ICD-10-CM

## 2014-08-07 DIAGNOSIS — R1013 Epigastric pain: Secondary | ICD-10-CM | POA: Diagnosis not present

## 2014-08-07 HISTORY — DX: Polyp of stomach and duodenum: K31.7

## 2014-08-07 MED ORDER — SODIUM CHLORIDE 0.9 % IV SOLN: 500.0000 mL | INTRAVENOUS | Status: DC

## 2014-08-07 NOTE — Op Note (Signed)
[facility] [address] [place], [id number]   ENDOSCOPY PROCEDURE REPORT  PATIENT: Lindsey Singleton, Lindsey Singleton  MR#: [id number] BIRTHDATE: 03/07/1971 , 43  yrs. old GENDER: female ENDOSCOPIST: Iva Booparl E Swain Acree, MD, [facility] PROCEDURE DATE:  08/07/2014 PROCEDURE:  EGD w/ biopsy ASA CLASS:     Class II INDICATIONS:  chronic epigastric pain, on meloxicam. MEDICATIONS: Propofol 200 mg IV and Monitored anesthesia care TOPICAL ANESTHETIC: none  DESCRIPTION OF PROCEDURE: After the risks benefits and alternatives of the procedure were thoroughly explained, informed consent was obtained.  The LB [id number] endoscope was introduced through the mouth and advanced to the second portion of the duodenum , Without limitations.  The instrument was slowly withdrawn as the mucosa was fully examined.    1) Numerouus soft, sessile proximal gastric polyps (body and fundus), maximum 1 cm.  Biopsies taken. 2) Otherwise normal EGD - antral biopsies taken to look for gastritis.  Retroflexed views revealed as previously described.     The scope was then withdrawn from the patient and the procedure completed.  COMPLICATIONS: There were no immediate complications.  ENDOSCOPIC IMPRESSION: 1) Numerouus soft, sessile proximal gastric polyps (body and fundus), maximum 1 cm.  Biopsies taken. 2) Otherwise normal EGD - antral biopsies taken to look for gastritis  RECOMMENDATIONS: 1.  Await pathology results 2.  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  already on PPi and sucralfate   eSigned:  Iva Booparl E Kresta Templeman, MD, [facility] 08/07/2014 2:47 PM    CC:The Patient and Milinda AntisKawanta Howard [place], MD

## 2014-08-07 NOTE — Patient Instructions (Addendum)
I found some stomach polyps - these look benign (not cancer) and should not be causing pain. Biopsies taken. I also took other stomach biopsies. I will notify you regarding the results and plans.  I appreciate the opportunity to care for you. Iva Boop, MD, FACG  YOU HAD AN ENDOSCOPIC PROCEDURE TODAY AT THE [facility]: Refer to the procedure report that was given to you for any specific questions about what was found during the examination.  If the procedure report does not answer your questions, please call your gastroenterologist to clarify.  If you requested that your care partner not be given the details of your procedure findings, then the procedure report has been included in a sealed envelope for you to review at your convenience later.  YOU SHOULD EXPECT: Some feelings of bloating in the abdomen. Passage of more gas than usual.  Walking can help get rid of the air that was put into your GI tract during the procedure and reduce the bloating.   DIET: Your first meal following the procedure should be a light meal and then it is ok to progress to your normal diet.  A half-sandwich or bowl of soup is an example of a good first meal.  Heavy or fried foods are harder to digest and may make you feel nauseous or bloated.  Likewise meals heavy in dairy and vegetables can cause extra gas to form and this can also increase the bloating.  Drink plenty of fluids but you should avoid alcoholic beverages for 24 hours.  ACTIVITY: Your care partner should take you home directly after the procedure.  You should plan to take it easy, moving slowly for the rest of the day.  You can resume normal activity the day after the procedure however you should NOT DRIVE or use heavy machinery for 24 hours (because of the sedation medicines used during the test).    SYMPTOMS TO REPORT IMMEDIATELY: A gastroenterologist can be reached at any hour.  During normal business hours, 8:30 AM to 5:00 PM  Monday through Friday, call [phone].  After hours and on weekends, please call the GI answering service at [phone] who will take a message and have the physician on call contact you.  Following upper endoscopy (EGD)  Vomiting of blood or coffee ground material  New chest pain or pain under the shoulder blades  Painful or persistently difficult swallowing  New shortness of breath  Fever of 100F or higher  Black, tarry-looking stools  FOLLOW UP: If any biopsies were taken you will be contacted by phone or by letter within the next 1-3 weeks.  Call your gastroenterologist if you have not heard about the biopsies in 3 weeks.  Our staff will call the home number listed on your records the next business day following your procedure to check on you and address any questions or concerns that you may have at that time regarding the information given to you following your procedure. This is a courtesy call and so if there is no answer at the home number and we have not heard from you through the emergency physician on call, we will assume that you have returned to your regular daily activities without incident.  SIGNATURES/CONFIDENTIALITY: You and/or your care partner have signed paperwork which will be entered into your electronic medical record.  These signatures attest to the fact that that the information above on your After Visit Summary has been reviewed and is understood.  Full  responsibility of the confidentiality of this discharge information lies with you and/or your care-partner.  Await pathology  Continue your normal medications  You might note some irritation in your nose or some drainage.  This may cause feels of congestion.  This is from the oxygen, which can be irritating.  There is no need for concern, this should clear up in a day or so

## 2014-08-07 NOTE — Progress Notes (Signed)
  [facility] Anesthesia Post-op Note  Patient: Lindsey FlockLuz Camacho  Procedure(s) Performed: endoscopy  Patient Location: [facility] - Recovery Area  Anesthesia Type: Deep Sedation/Propofol  Level of Consciousness: awake, oriented and patient cooperative  Airway and Oxygen Therapy: Patient Spontanous Breathing  Post-op Pain: none  Post-op Assessment:  Post-op Vital signs reviewed, Patient's Cardiovascular Status Stable, Respiratory Function Stable, Patent Airway, No signs of Nausea or vomiting and Pain level controlled  Post-op Vital Signs: Reviewed and stable  Complications: No apparent anesthesia complications  Camia Dipinto E 2:42 PM

## 2014-08-07 NOTE — Progress Notes (Signed)
Called to room to assist during endoscopic procedure.  Patient ID and intended procedure confirmed with present staff. Received instructions for my participation in the procedure from the performing physician.  

## 2014-08-08 ENCOUNTER — Telehealth: Payer: Self-pay | Admitting: *Deleted

## 2014-08-08 NOTE — Telephone Encounter (Signed)
  Follow up Call-  Call back number 08/07/2014  Post procedure Call Back phone  # speaks very little english [phone]  Permission to leave phone message Yes     Patient questions:  Do you have a fever, pain , or abdominal swelling? Yes.   Pain Score  3 *  Have you tolerated food without any problems? Yes.    Have you been able to return to your normal activities? Yes.    Do you have any questions about your discharge instructions: Diet   No. Medications  No. Follow up visit  No.  Do you have questions or concerns about your Care? No.  Actions: * If pain score is 4 or above: No action needed, pain <4.

## 2014-08-15 ENCOUNTER — Other Ambulatory Visit: Payer: Self-pay | Admitting: Family Medicine

## 2014-08-15 DIAGNOSIS — Z1231 Encounter for screening mammogram for malignant neoplasm of breast: Secondary | ICD-10-CM

## 2014-08-15 NOTE — Progress Notes (Signed)
Quick Note:  Let her know from office that biopsies show some inactive gastritis and the polyps are benign and not a problem.  I think nerves and muscles of stomach are causing problems - functional dyspepsia  Rec: amitriptyline 25 mg at bedtime # 30 2 RF REV me 2 months  [facility] - no letter or recall ______

## 2014-08-16 ENCOUNTER — Other Ambulatory Visit: Payer: Self-pay

## 2014-08-16 MED ORDER — AMITRIPTYLINE HCL 25 MG PO TABS: 25.0000 mg | ORAL_TABLET | Freq: Every day | ORAL | Status: DC

## 2014-08-22 ENCOUNTER — Ambulatory Visit (HOSPITAL_COMMUNITY)
Admission: RE | Admit: 2014-08-22 | Discharge: 2014-08-22 | Disposition: A | Discharge status: Home / Self Care | Payer: 59 | Source: Ambulatory Visit | Attending: Family Medicine | Admitting: Family Medicine

## 2014-08-22 DIAGNOSIS — Z1231 Encounter for screening mammogram for malignant neoplasm of breast: Secondary | ICD-10-CM

## 2014-09-11 ENCOUNTER — Encounter: Payer: Self-pay | Admitting: Physician Assistant

## 2014-09-11 ENCOUNTER — Ambulatory Visit (INDEPENDENT_AMBULATORY_CARE_PROVIDER_SITE_OTHER): Payer: 59 | Admitting: Physician Assistant

## 2014-09-11 VITALS — BP 110/72 | HR 84 | Temp 98.2°F | Resp 18 | Wt 177.0 lb

## 2014-09-11 DIAGNOSIS — J988 Other specified respiratory disorders: Secondary | ICD-10-CM | POA: Diagnosis not present

## 2014-09-11 DIAGNOSIS — J029 Acute pharyngitis, unspecified: Secondary | ICD-10-CM

## 2014-09-11 DIAGNOSIS — B9689 Other specified bacterial agents as the cause of diseases classified elsewhere: Principal | ICD-10-CM

## 2014-09-11 LAB — RAPID STREP SCREEN (MED CTR MEBANE ONLY): Streptococcus, Group A Screen (Direct): NEGATIVE

## 2014-09-11 MED ORDER — AZITHROMYCIN 250 MG PO TABS: ORAL_TABLET | ORAL | Status: DC

## 2014-09-11 NOTE — Progress Notes (Signed)
Patient ID: Lindsey Singleton MRN: [id number], DOB: 01/19/1971, 44 y.o. Date of Encounter: 09/11/2014, 4:13 PM    Chief Complaint:  Chief Complaint  Patient presents with  . sick x 5 days    sore throat, congestion     HPI: 44 y.o. year old Hispanic female or with family member who is able to translate [place] and [place]. Recently saw patient's husband for office visit with similar symptoms. A shunt states that she has been sick for 5 days. Nasal congestion with drainage down her throat and sore throat. No significant chest congestion. Symptoms much worse at night and in the morning but do persist through the day. No significant fevers or chills.     Home Meds:   Outpatient Prescriptions Prior to Visit  Medication Sig Dispense Refill  . amitriptyline (ELAVIL) 25 MG tablet Take 1 tablet (25 mg total) by mouth at bedtime. 30 tablet 2  . clobetasol (TEMOVATE) 0.05 % external solution Apply 1 application topically 2 (two) times daily as needed. To scalp 50 mL 2  . dicyclomine (BENTYL) 20 MG tablet Take 1 tablet (20 mg total) by mouth 4 (four) times daily -  before meals and at bedtime. 120 tablet 1  . ketoconazole (NIZORAL) 2 % shampoo Apply 1 application topically. 2-3 times per month    . niacin (NIASPAN) 500 MG CR tablet Take 1 tablet (500 mg total) by mouth at bedtime. 30 tablet 3  . nystatin cream (MYCOSTATIN) Apply 1 application topically 2 (two) times daily. 30 g 0  . pantoprazole (PROTONIX) 40 MG tablet Take 1 tablet (40 mg total) by mouth 2 (two) times daily. 60 tablet 3  . sucralfate (CARAFATE) 1 G tablet Take 1 tablet (1 g total) by mouth 4 (four) times daily -  with meals and at bedtime. 120 tablet 0  . traMADol (ULTRAM) 50 MG tablet Take 1 tablet (50 mg total) by mouth every 6 (six) hours as needed. 120 tablet 1  . valACYclovir (VALTREX) 500 MG tablet Take 1 tablet (500 mg total) by mouth daily. 30 tablet 11   No facility-administered medications prior to visit.     Allergies:  Allergies  Allergen Reactions  . Cephalexin Hives and Itching  . Simvastatin Other (See Comments)    Bone pain, dry mouth  . Gabapentin   . Pravastatin Swelling    Facial swelling and stomach pains  . Zetia [Ezetimibe]     Bone Pain      Review of Systems: See HPI for pertinent ROS. All other ROS negative.    Physical Exam: Blood pressure 110/72, pulse 84, temperature 98.2 F (36.8 C), temperature source Oral, resp. rate 18, weight 177 lb (80.287 kg), last menstrual period 08/19/2014., Body mass index is 34.57 kg/(m^2). General:  WNWD Hispanic Female. Appears in no acute distress. HEENT: Normocephalic, atraumatic, eyes without discharge, sclera non-icteric, nares are without discharge. Bilateral auditory canals clear, TM's are without perforation, pearly grey and translucent with reflective cone of light bilaterally. Oral cavity moist, posterior pharynx with mild erythema but without exudate, peritonsillar abscess. No tenderness with percussion of frontal and maxillary sinuses bilaterally.  Neck: Supple. No thyromegaly. No lymphadenopathy. Lungs: Clear bilaterally to auscultation without wheezes, rales, or rhonchi. Breathing is unlabored. Heart: Regular rhythm. No murmurs, rubs, or gallops. Msk:  Strength and tone normal for age. Extremities/Skin: Warm and dry.  Neuro: Alert and oriented X 3. Moves all extremities spontaneously. Gait is normal. CNII-XII grossly in tact. Psych:  Responds to questions  appropriately with a normal affect.     ASSESSMENT AND PLAN:  44 y.o. year old female with  1. Bacterial respiratory infection  Rapid strep test negative. She is using some over-the-counter decongestants. I told her she can continue to take this as needed for symptom relief. Take antibiotic as directed and complete all of it. Follow-up if symptoms do not resolve within 1 week after completion of antibiotic. - azithromycin (ZITHROMAX) 250 MG tablet; Day 1: Take 2  daily. Days 2-5: Take 1 daily.  Dispense: 6 tablet; Refill: 0  2. Sorethroat - Rapid strep screen   Signed, Lindsey Singleton, [place], [facility] 09/11/2014 4:13 PM

## 2014-09-17 ENCOUNTER — Other Ambulatory Visit: Payer: Self-pay | Admitting: Internal Medicine

## 2014-09-18 ENCOUNTER — Telehealth: Payer: Self-pay | Admitting: Family Medicine

## 2014-09-18 NOTE — Telephone Encounter (Signed)
May I refill Sir? 

## 2014-09-18 NOTE — Telephone Encounter (Signed)
Refill x 1 ok 

## 2014-09-18 NOTE — Telephone Encounter (Signed)
Patient is calling to say that the cough med that mary beth prescribed is not working   [phone]

## 2014-09-21 NOTE — Telephone Encounter (Signed)
Call placed to patient. LMTRC.  

## 2014-09-21 NOTE — Telephone Encounter (Signed)
Levaquin 750mg  1 po QD x 7 days. # 7 +0 Tell her that if symptoms do not resolve after completion of this, then will need a follow-up office visit.

## 2014-09-22 MED ORDER — LEVOFLOXACIN 750 MG PO TABS: 750.0000 mg | ORAL_TABLET | Freq: Every day | ORAL | Status: DC

## 2014-09-22 NOTE — Telephone Encounter (Signed)
Call placed to patient and patient husband ade aware.   Prescription sent to pharmacy.

## 2014-10-17 ENCOUNTER — Encounter: Payer: Self-pay | Admitting: Internal Medicine

## 2014-10-17 ENCOUNTER — Ambulatory Visit (INDEPENDENT_AMBULATORY_CARE_PROVIDER_SITE_OTHER): Payer: Medicaid Other | Admitting: Internal Medicine

## 2014-10-17 VITALS — BP 120/90 | HR 72 | Ht 60.0 in | Wt 177.4 lb

## 2014-10-17 DIAGNOSIS — K30 Functional dyspepsia: Secondary | ICD-10-CM

## 2014-10-17 DIAGNOSIS — R1013 Epigastric pain: Secondary | ICD-10-CM

## 2014-10-17 DIAGNOSIS — M19041 Primary osteoarthritis, right hand: Secondary | ICD-10-CM | POA: Diagnosis not present

## 2014-10-17 DIAGNOSIS — M19042 Primary osteoarthritis, left hand: Secondary | ICD-10-CM | POA: Diagnosis not present

## 2014-10-17 MED ORDER — AMITRIPTYLINE HCL 25 MG PO TABS: 25.0000 mg | ORAL_TABLET | Freq: Every day | ORAL | Status: DC

## 2014-10-17 MED ORDER — NAPROXEN 250 MG PO TABS: 250.0000 mg | ORAL_TABLET | Freq: Two times a day (BID) | ORAL | Status: DC

## 2014-10-17 MED ORDER — PANTOPRAZOLE SODIUM 40 MG PO TBEC: 40.0000 mg | DELAYED_RELEASE_TABLET | Freq: Every day | ORAL | Status: DC

## 2014-10-17 NOTE — Progress Notes (Signed)
   Subjective:    Patient ID: Lindsey Singleton, female    DOB: Jul 22, 1970, 44 y.o.   MRN: [id number] Cc: epigastric pain - f/u HPI  Started amitriptyline 25 mg hs after negative EGD 2 months ago Just a little abdominal pain now - better sleeping well - first time in years  Here with spouse and interpreter Medications, allergies, past medical history, past surgical history, family history and social history are reviewed and updated in the EMR.  Review of Systems Fingers and hands hurt - cannot knit Tramadol not helping Hx meloxicam but ? Cardiac side effect    Objective:   Physical Exam BP 120/90 mmHg  Pulse 72  Ht 5' (1.524 m)  Wt 177 lb 6.4 oz (80.468 kg)  BMI 34.65 kg/m2  LMP 10/09/2014 (Approximate) NAD  PIP joints thickened and firm in digits - worst on right index and left index       Assessment & Plan:  Functional dyspepsia - Plan: amitriptyline (ELAVIL) 25 MG tablet  Abdominal pain, epigastric - Plan: pantoprazole (PROTONIX) 40 MG tablet  Primary osteoarthritis of both hands    1. Continue amitriptyline 2. Reduce pantoprazole to qd and hopefull stop later 3. Naproxen 250 mg bid for OA - needs f/u PCP for further rx 4. See me 3 months  WU:JWJXBJCc:[place], Kingsley SpittleKAWANTA, MD

## 2014-10-17 NOTE — Patient Instructions (Addendum)
We have sent the following medications to your pharmacy for you to pick up at your convenience: Naprosyn  Follow up with your primary care Doctor for your arthritis as needed.   Dr Leone PayorGessner has decreased your dose on Protonix to one daily.   Follow up with Dr Leone PayorGessner on 01/04/15 at 11:00AM    I appreciate the opportunity to care for you.

## 2014-12-05 ENCOUNTER — Other Ambulatory Visit: Payer: Self-pay | Admitting: Family Medicine

## 2014-12-05 ENCOUNTER — Other Ambulatory Visit: Payer: Self-pay | Admitting: Internal Medicine

## 2014-12-05 ENCOUNTER — Telehealth: Payer: Self-pay | Admitting: Internal Medicine

## 2014-12-05 DIAGNOSIS — K30 Functional dyspepsia: Secondary | ICD-10-CM

## 2014-12-06 MED ORDER — AMITRIPTYLINE HCL 25 MG PO TABS: 25.0000 mg | ORAL_TABLET | Freq: Every day | ORAL | Status: DC

## 2014-12-06 MED ORDER — PANTOPRAZOLE SODIUM 40 MG PO TBEC: DELAYED_RELEASE_TABLET | ORAL | Status: DC

## 2014-12-06 MED ORDER — DICYCLOMINE HCL 20 MG PO TABS: ORAL_TABLET | ORAL | Status: DC

## 2014-12-06 NOTE — Telephone Encounter (Signed)
Patient informed rx's sent in [facility] [place](Yesi called her) to relay message in spanish.

## 2014-12-06 NOTE — Telephone Encounter (Signed)
Refill x 2 

## 2014-12-06 NOTE — Telephone Encounter (Signed)
May I refill her medicines requested except for the niacin which was done yesterday by Dr Deberah PeltonKawanta [place] Sir?  She has an upcoming appointment 01/04/15.

## 2015-01-04 ENCOUNTER — Ambulatory Visit (INDEPENDENT_AMBULATORY_CARE_PROVIDER_SITE_OTHER): Payer: 59 | Admitting: Internal Medicine

## 2015-01-04 ENCOUNTER — Encounter: Payer: Self-pay | Admitting: Internal Medicine

## 2015-01-04 VITALS — BP 118/66 | HR 70 | Ht 60.0 in | Wt 180.0 lb

## 2015-01-04 DIAGNOSIS — K2990 Gastroduodenitis, unspecified, without bleeding: Secondary | ICD-10-CM | POA: Diagnosis not present

## 2015-01-04 DIAGNOSIS — K2970 Gastritis, unspecified, without bleeding: Secondary | ICD-10-CM

## 2015-01-04 DIAGNOSIS — K219 Gastro-esophageal reflux disease without esophagitis: Secondary | ICD-10-CM

## 2015-01-04 DIAGNOSIS — K30 Functional dyspepsia: Secondary | ICD-10-CM

## 2015-01-04 MED ORDER — PANTOPRAZOLE SODIUM 20 MG PO TBEC: 20.0000 mg | DELAYED_RELEASE_TABLET | Freq: Every day | ORAL | Status: DC

## 2015-01-04 NOTE — Patient Instructions (Signed)
  Please stop your dicyclomine.   Decrease your pantoprazole to 20mg  daily.   Follow up with Dr. Leone PayorGessner in a year.    I appreciate the opportunity to care for you. Stan Headarl Gessner, MD, [facility]

## 2015-01-05 NOTE — Progress Notes (Signed)
   Subjective:    Patient ID: Lindsey Singleton, female    DOB: 1971-06-13, 44 y.o.   MRN: [id number] Cc: f/u epigastric pain HPI Overall much better using PPI and nocturnal amitriptyline and dicyclomine. If misses pantoprazole gets heartburn. Interpreter helps w/ hx. Sleeping better also.  Medications, allergies, past medical history, past surgical history, family history and social history are reviewed and updated in the EMR.  Review of Systems As above, also dry mouth    Objective:   Physical Exam BP 118/66 mmHg  Pulse 70  Ht 5' (1.524 m)  Wt 180 lb (81.647 kg)  BMI 35.15 kg/m2    Assessment & Plan:   Functional dyspepsia - Plan: pantoprazole (PROTONIX) 20 MG tablet  Gastroesophageal reflux disease without esophagitis - Plan: pantoprazole (PROTONIX) 20 MG tablet  Gastritis and gastroduodenitis - Plan: pantoprazole (PROTONIX) 20 MG tablet  Improved. Will try to reduce PPI dose and dc nocturnal dicyclomine. Call back prn if this plan does not work  See me 1 year routine   15 minutes time spent with patient > half in counseling coordination of care  WU:JWJXBJCc:[place], Kingsley SpittleKAWANTA, MD

## 2015-01-22 ENCOUNTER — Telehealth: Payer: Self-pay | Admitting: Family Medicine

## 2015-01-22 ENCOUNTER — Ambulatory Visit (INDEPENDENT_AMBULATORY_CARE_PROVIDER_SITE_OTHER): Payer: 59 | Admitting: Physician Assistant

## 2015-01-22 ENCOUNTER — Encounter: Payer: Self-pay | Admitting: Physician Assistant

## 2015-01-22 VITALS — BP 110/70 | HR 80 | Temp 97.7°F | Resp 18 | Wt 178.0 lb

## 2015-01-22 DIAGNOSIS — M19042 Primary osteoarthritis, left hand: Secondary | ICD-10-CM | POA: Diagnosis not present

## 2015-01-22 DIAGNOSIS — G5601 Carpal tunnel syndrome, right upper limb: Secondary | ICD-10-CM | POA: Diagnosis not present

## 2015-01-22 DIAGNOSIS — M19041 Primary osteoarthritis, right hand: Secondary | ICD-10-CM | POA: Diagnosis not present

## 2015-01-22 DIAGNOSIS — G5602 Carpal tunnel syndrome, left upper limb: Secondary | ICD-10-CM | POA: Diagnosis not present

## 2015-01-22 DIAGNOSIS — G5603 Carpal tunnel syndrome, bilateral upper limbs: Secondary | ICD-10-CM

## 2015-01-22 DIAGNOSIS — R4789 Other speech disturbances: Secondary | ICD-10-CM

## 2015-01-22 DIAGNOSIS — G5600 Carpal tunnel syndrome, unspecified upper limb: Secondary | ICD-10-CM | POA: Insufficient documentation

## 2015-01-22 DIAGNOSIS — IMO0001 Reserved for inherently not codable concepts without codable children: Secondary | ICD-10-CM

## 2015-01-22 MED ORDER — NAPROXEN 250 MG PO TABS: 250.0000 mg | ORAL_TABLET | Freq: Two times a day (BID) | ORAL | Status: DC

## 2015-01-22 MED ORDER — MELOXICAM 7.5 MG PO TABS: 7.5000 mg | ORAL_TABLET | Freq: Every day | ORAL | Status: DC

## 2015-01-22 MED ORDER — UNABLE TO FIND: Status: AC

## 2015-01-22 NOTE — Progress Notes (Signed)
Patient ID: Lindsey Singleton MRN: [id number], DOB: November 03, 1970, 44 y.o. Date of Encounter: 01/22/2015, 10:57 AM    Chief Complaint:  Chief Complaint  Patient presents with  . Hand Pain    bilateral, states very long time, sometimes they swell, when closes hand they make a crack     HPI: 44 y.o. year old Hispanic female with Spanish-speaking interpreter.  Reports that she has always done a lot of work with her hands. 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 Regardless of her "job", has always done a lot of cooking, knitting, etc---  constantly using her hands. She has been on crutches for 6 years secondary to fractures in her left leg which have required 3 surgeries. She has noticed that the symptoms in her hands and wrists have been worse since being on these crutches and having to grip them.  She sometimes gets tingling sensations-- in all fingers of both hands. He also has achy pain in her fingers. A lot of mornings wakes up with her fingers feeling swollen and stiff. Has pain in her hands when she sleeps at night. Wakes up and realizes that she is sleeping with her hands curled up and will have to shake them out and move them. At times gets very significant amount of pain shooting in her hands. Takes tramadol and sometimes has to take a second tramadol to get any relief at all.  She says that she has symptoms in both hands and wrists bilaterally but says that the right is worse than the left. She is right handed.     Home Meds:   Outpatient Prescriptions Prior to Visit  Medication Sig Dispense Refill  . amitriptyline (ELAVIL) 25 MG tablet Take 1 tablet (25 mg total) by mouth at bedtime. 30 tablet 1  . ketoconazole (NIZORAL) 2 % shampoo Apply 1 application topically. 2-3 times per month    . niacin (NIASPAN) 500 MG CR tablet TAKE 1 TABLET (500 MG TOTAL) BY MOUTH AT BEDTIME. 30 tablet 3  . nystatin cream (MYCOSTATIN) Apply 1 application topically 2 (two) times daily. 30 g 0  .  pantoprazole (PROTONIX) 20 MG tablet Take 1 tablet (20 mg total) by mouth daily before breakfast. 30 tablet 11  . traMADol (ULTRAM) 50 MG tablet Take 1 tablet (50 mg total) by mouth every 6 (six) hours as needed. 120 tablet 1  . valACYclovir (VALTREX) 500 MG tablet Take 1 tablet (500 mg total) by mouth daily. 30 tablet 11  . naproxen (NAPROSYN) 250 MG tablet Take 1 tablet (250 mg total) by mouth 2 (two) times daily with a meal. 60 tablet 1   No facility-administered medications prior to visit.    Allergies:  Allergies  Allergen Reactions  . Cephalexin Hives and Itching  . Simvastatin Other (See Comments)    Bone pain, dry mouth  . Gabapentin   . Pravastatin Swelling    Facial swelling and stomach pains  . Zetia [Ezetimibe]     Bone Pain      Review of Systems: See HPI for pertinent ROS. All other ROS negative.    Physical Exam: Blood pressure 110/70, pulse 80, temperature 97.7 F (36.5 C), temperature source Oral, resp. rate 18, weight 178 lb (80.74 kg)., Body mass index is 34.76 kg/(m^2). General:  Hispanic female. Appears in no acute distress. Neck: Supple. No thyromegaly. No lymphadenopathy. Lungs: Clear bilaterally to auscultation without wheezes, rales, or rhonchi. Breathing is unlabored. Heart: Regular rhythm. No murmurs, rubs,  or gallops. Msk:  Strength and tone normal for age. Wrists and Hands Bilaterally:  Mild enlargement of joints of fingers. O/w inspection normal. Tinnels normal. Phalens causes tingling in fingers of both hands.  ROM intact of fingers and wrists. There is popping sound when she rotates right wrist.  Extremities/Skin: Warm and dry.  Neuro: Alert and oriented X 3. Moves all extremities spontaneously. Gait is normal. CNII-XII grossly in tact. Psych:  Responds to questions appropriately with a normal affect.     ASSESSMENT AND PLAN:  44 y.o. year old female with  1. Osteoarthritis of both hands, unspecified osteoarthritis type  - UNABLE TO FIND;  Dispense BILATERAL WRIST SPLINTS.  CTS(carpal tunnel splint) wrist support  Dispense: 2 each; Refill: 0 - naproxen (NAPROSYN) 250 MG tablet; Take 1 tablet (250 mg total) by mouth 2 (two) times daily with a meal.  Dispense: 60 tablet; Refill: 1  2. Bilateral carpal tunnel syndrome - UNABLE TO FIND; Dispense BILATERAL WRIST SPLINTS.  CTS(carpal tunnel splint) wrist support  Dispense: 2 each; Refill: 0 - naproxen (NAPROSYN) 250 MG tablet; Take 1 tablet (250 mg total) by mouth 2 (two) times daily with a meal.  Dispense: 60 tablet; Refill: 1  3. Spanish speaking patient   I saw her recent GI note. That note mentioned using current Naprosyn 250 mg for the symptoms. So discussed with patient that she has had intolerance to Modic in the past. Also reviewed that she has gastritis and GERD said need to avoid high-dose NSAIDs. Liver think she would benefit from some anti-inflammatory. Told her to take the Naprosyn 250 mg twice a day with food regularly for 2 weeks. She can use tramadol in addition for any breakthrough pain. She is to take this prescription for the wrist splints to medical supply and wear splints as much as possible. Wear them at night. Wear them as much as possible during the day. Discussed that we are wanting her to keep her wrist and hands in a neutral position as much as possible and to avoid repetitive motion as much as possible. Not to do anything that involves use of her hands and wrist that is not absolutely necessary. Did review the last set of x-rays that I could see.  Had x-ray of the right hand 11/15/2010 that was normal. X-ray of the left wrist and left hand 11/26/2005 which were normal.  Told her to stick with this treatment and adhere to it as much as possible for 2-3 weeks and then follow-up if symptoms are not improving at all. May need to see a hand specialist such as Dr. Melvyn Novasrtmann or go to [facility] if symptoms do not improve with this.   Signed, [address]Mary Beth WatovaDixon, [place],  [facility] 01/22/2015 10:57 AM

## 2015-01-22 NOTE — Telephone Encounter (Signed)
Correct. Agree.

## 2015-01-22 NOTE — Telephone Encounter (Signed)
Pharmacy calling to question Rx brought in from patient for both Mobic and Naprosyn.  Looking at office visit note from today.  Shows intolerance to Mobic in past.  Pharmacy told to discontinue Mobic.

## 2015-03-09 ENCOUNTER — Encounter: Payer: Self-pay | Admitting: Family Medicine

## 2015-03-09 ENCOUNTER — Ambulatory Visit (INDEPENDENT_AMBULATORY_CARE_PROVIDER_SITE_OTHER): Payer: 59 | Admitting: Family Medicine

## 2015-03-09 VITALS — BP 118/74 | HR 82 | Temp 98.7°F | Resp 16 | Wt 173.0 lb

## 2015-03-09 DIAGNOSIS — E785 Hyperlipidemia, unspecified: Secondary | ICD-10-CM | POA: Diagnosis not present

## 2015-03-09 DIAGNOSIS — N760 Acute vaginitis: Secondary | ICD-10-CM | POA: Diagnosis not present

## 2015-03-09 DIAGNOSIS — R300 Dysuria: Secondary | ICD-10-CM

## 2015-03-09 LAB — URINALYSIS, ROUTINE W REFLEX MICROSCOPIC
Bilirubin Urine: NEGATIVE
GLUCOSE, UA: NEGATIVE
Ketones, ur: NEGATIVE
Nitrite: NEGATIVE
PROTEIN: NEGATIVE
Specific Gravity, Urine: 1.02 (ref 1.001–1.035)
pH: 8.5 — ABNORMAL HIGH (ref 5.0–8.0)

## 2015-03-09 LAB — LIPID PANEL
Cholesterol: 211 mg/dL — ABNORMAL HIGH (ref 125–200)
HDL: 48 mg/dL (ref >=46)
LDL CALC: 130 mg/dL — AB (ref <130)
Total CHOL/HDL Ratio: 4.4 Ratio (ref <=5.0)
Triglycerides: 165 mg/dL — ABNORMAL HIGH (ref <150)
VLDL: 33 mg/dL — AB (ref <30)

## 2015-03-09 LAB — COMPLETE METABOLIC PANEL WITH GFR
ALT: 19 U/L (ref 6–29)
AST: 15 U/L (ref 10–30)
Albumin: 3.9 g/dL (ref 3.6–5.1)
Alkaline Phosphatase: 52 U/L (ref 33–115)
BUN: 9 mg/dL (ref 7–25)
CO2: 22 mmol/L (ref 20–31)
CREATININE: 0.52 mg/dL (ref 0.50–1.10)
Calcium: 8.9 mg/dL (ref 8.6–10.2)
Chloride: 106 mmol/L (ref 98–110)
GFR, Est African American: >89 mL/min (ref >=60)
GFR, Est Non African American: >89 mL/min (ref >=60)
GLUCOSE: 85 mg/dL (ref 70–99)
Potassium: 4 mmol/L (ref 3.5–5.3)
Sodium: 137 mmol/L (ref 135–146)
TOTAL PROTEIN: 6.6 g/dL (ref 6.1–8.1)
Total Bilirubin: 0.3 mg/dL (ref 0.2–1.2)

## 2015-03-09 LAB — URINALYSIS, MICROSCOPIC ONLY
Casts: NONE SEEN [LPF]
Crystals: NONE SEEN [HPF]
Yeast: NONE SEEN [HPF]

## 2015-03-09 LAB — WET PREP FOR TRICH, YEAST, CLUE: Trich, Wet Prep: NONE SEEN

## 2015-03-09 MED ORDER — METRONIDAZOLE 500 MG PO TABS: 500.0000 mg | ORAL_TABLET | Freq: Three times a day (TID) | ORAL | Status: DC

## 2015-03-09 MED ORDER — FLUCONAZOLE 150 MG PO TABS: 150.0000 mg | ORAL_TABLET | Freq: Once | ORAL | Status: DC

## 2015-03-09 NOTE — Addendum Note (Signed)
Addended by: Alean Rinne A on: 03/09/2015 03:02 PM   Modules accepted: Orders

## 2015-03-09 NOTE — Progress Notes (Signed)
Subjective:    Patient ID: Lindsey Singleton, female    DOB: 03-15-1971, 44 y.o.   MRN: [id number]  HPI  Patient reports a one-week history of vaginal discharge, vaginal itching, vaginal irritation. She also reports some mild discomfort with urination. Urinalysis shows 3+ leukocyte esterase however there are very few white blood cells seen on microscopic exam. There appears to be vaginal contamination in the urine sample with many epithelial cells and lots of bacteria. On speculum exam, the patient has copious vaginal discharge that appears to be yeast. There is a negative whiff test. There is no cervical motion tenderness. There is no vaginal bleeding. The patient has no pelvic pain. Past Medical History  Diagnosis Date  . Asthma   . Chronic kidney disease     recurrent pyelo  . Gastritis     no current medication  . Hyperlipidemia     no medication  . 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 Gastric polyps 08/07/2014   Past Surgical History  Procedure Laterality Date  . Open reduction malar fracture    . Appendectomy    . Cesarean section  1992, 2005    x 2  . Esophagogastroduodenoscopy     Current Outpatient Prescriptions on File Prior to Visit  Medication Sig Dispense Refill  . amitriptyline (ELAVIL) 25 MG tablet Take 1 tablet (25 mg total) by mouth at bedtime. 30 tablet 1  . ketoconazole (NIZORAL) 2 % shampoo Apply 1 application topically. 2-3 times per month    . meloxicam (MOBIC) 7.5 MG tablet Take 1 tablet (7.5 mg total) by mouth daily. With food. As needed for pain in hands. 30 tablet 0  . naproxen (NAPROSYN) 250 MG tablet Take 1 tablet (250 mg total) by mouth 2 (two) times daily with a meal. 60 tablet 1  . niacin (NIASPAN) 500 MG CR tablet TAKE 1 TABLET (500 MG TOTAL) BY MOUTH AT BEDTIME. 30 tablet 3  . pantoprazole (PROTONIX) 20 MG tablet Take 1 tablet (20 mg total) by mouth daily before breakfast. 30 tablet 11  . traMADol (ULTRAM) 50 MG tablet Take 1 tablet (50 mg total)  by mouth every 6 (six) hours as needed. 120 tablet 1  . UNABLE TO FIND Dispense BILATERAL WRIST SPLINTS.  CTS(carpal tunnel splint) wrist support 2 each 0  . valACYclovir (VALTREX) 500 MG tablet Take 1 tablet (500 mg total) by mouth daily. 30 tablet 11   No current facility-administered medications on file prior to visit.   Allergies  Allergen Reactions  . Cephalexin Hives and Itching  . Simvastatin Other (See Comments)    Bone pain, dry mouth  . Gabapentin   . Pravastatin Swelling    Facial swelling and stomach pains  . Zetia [Ezetimibe]     Bone Pain   Social History   Social History  . Marital Status: Married    Spouse Name: N/A  . Number of Children: 3  . Years of Education: N/A   Occupational History  . Not on file.   Social History Main Topics  . Smoking status: Never Smoker   . Smokeless tobacco: Never Used  . Alcohol Use: No  . Drug Use: No  . Sexual Activity: No   Other Topics Concern  . Not on file   Social History Narrative   Patient is from [place], he is married 3 children      Review of Systems  All other systems reviewed and are negative.      Objective:  Physical Exam  Constitutional: She appears well-developed and well-nourished.  Cardiovascular: Normal rate, regular rhythm and normal heart sounds.   Pulmonary/Chest: Effort normal and breath sounds normal.  Abdominal: Soft. Bowel sounds are normal. She exhibits no distension. There is no tenderness. There is no rebound and no guarding. Hernia confirmed negative in the right inguinal area and confirmed negative in the left inguinal area.  Genitourinary: There is no rash on the right labia. There is no rash on the left labia. Uterus is not tender. Cervix exhibits discharge. Cervix exhibits no motion tenderness and no friability. Right adnexum displays no tenderness. There is erythema in the vagina. Vaginal discharge found.  Lymphadenopathy:       Right: No inguinal adenopathy present.        Left: No inguinal adenopathy present.  Vitals reviewed.         Assessment & Plan:  Burning with urination - Plan: Urinalysis, Routine w reflex microscopic (not at [facility])  Vaginitis and vulvovaginitis - Plan: WET PREP FOR TRICH, YEAST, CLUE  Hyperlipidemia - Plan: COMPLETE METABOLIC PANEL WITH GFR, Lipid panel  I believe the patient mainly has yeast infection. I'll treat her with Diflucan 150 mg by mouth 1. However wet prep also shows clue cells. I will treat this with Flagyl 500 mg by mouth twice a day for 1 week. While the patient is here she is requesting that we recheck her cholesterol as she is gone 12 hours without eating. I will gladly get her cholesterol she is here today to avoid another trip back.

## 2015-03-11 LAB — URINE CULTURE: Colony Count: 4000

## 2015-03-13 ENCOUNTER — Encounter: Payer: Self-pay | Admitting: Family Medicine

## 2015-04-21 ENCOUNTER — Other Ambulatory Visit: Payer: Self-pay | Admitting: Internal Medicine

## 2015-05-27 ENCOUNTER — Other Ambulatory Visit: Payer: Self-pay | Admitting: Internal Medicine

## 2015-05-29 ENCOUNTER — Telehealth: Payer: Self-pay | Admitting: Family Medicine

## 2015-05-29 NOTE — Telephone Encounter (Signed)
Son calling, mother does not speak [place].  He is not with mother.  Says mother has been having a severe headache for over 1 week and now past few days c/o chest pain.  Advise she go to ED immediately to be evaluated.

## 2015-05-30 ENCOUNTER — Telehealth: Payer: Self-pay | Admitting: *Deleted

## 2015-05-30 ENCOUNTER — Ambulatory Visit: Payer: 59 | Admitting: Family Medicine

## 2015-05-30 ENCOUNTER — Encounter: Payer: Self-pay | Admitting: Family Medicine

## 2015-05-30 ENCOUNTER — Ambulatory Visit (INDEPENDENT_AMBULATORY_CARE_PROVIDER_SITE_OTHER): Payer: Medicaid Other | Admitting: Family Medicine

## 2015-05-30 VITALS — BP 128/74 | HR 84 | Temp 98.1°F | Resp 16 | Ht 60.0 in | Wt 170.0 lb

## 2015-05-30 DIAGNOSIS — R079 Chest pain, unspecified: Secondary | ICD-10-CM

## 2015-05-30 DIAGNOSIS — H539 Unspecified visual disturbance: Secondary | ICD-10-CM | POA: Diagnosis not present

## 2015-05-30 DIAGNOSIS — F4329 Adjustment disorder with other symptoms: Secondary | ICD-10-CM

## 2015-05-30 DIAGNOSIS — E785 Hyperlipidemia, unspecified: Secondary | ICD-10-CM | POA: Diagnosis not present

## 2015-05-30 DIAGNOSIS — K2970 Gastritis, unspecified, without bleeding: Secondary | ICD-10-CM

## 2015-05-30 DIAGNOSIS — K30 Functional dyspepsia: Secondary | ICD-10-CM

## 2015-05-30 DIAGNOSIS — K2990 Gastroduodenitis, unspecified, without bleeding: Secondary | ICD-10-CM

## 2015-05-30 DIAGNOSIS — K219 Gastro-esophageal reflux disease without esophagitis: Secondary | ICD-10-CM

## 2015-05-30 DIAGNOSIS — Z23 Encounter for immunization: Secondary | ICD-10-CM | POA: Diagnosis not present

## 2015-05-30 LAB — COMPREHENSIVE METABOLIC PANEL
ALK PHOS: 63 U/L (ref 33–115)
ALT: 17 U/L (ref 6–29)
AST: 13 U/L (ref 10–30)
Albumin: 4 g/dL (ref 3.6–5.1)
BUN: 11 mg/dL (ref 7–25)
CO2: 22 mmol/L (ref 20–31)
CREATININE: 0.52 mg/dL (ref 0.50–1.10)
Calcium: 9 mg/dL (ref 8.6–10.2)
Chloride: 105 mmol/L (ref 98–110)
GLUCOSE: 76 mg/dL (ref 70–99)
POTASSIUM: 4.3 mmol/L (ref 3.5–5.3)
SODIUM: 138 mmol/L (ref 135–146)
Total Bilirubin: 0.4 mg/dL (ref 0.2–1.2)
Total Protein: 7.1 g/dL (ref 6.1–8.1)

## 2015-05-30 LAB — CBC WITH DIFFERENTIAL/PLATELET
BASOS ABS: 0.1 10*3/uL (ref 0.0–0.1)
BASOS PCT: 1 % (ref 0–1)
EOS ABS: 0.3 10*3/uL (ref 0.0–0.7)
EOS PCT: 4 % (ref 0–5)
HCT: 40.6 % (ref 36.0–46.0)
Hemoglobin: 13.4 g/dL (ref 12.0–15.0)
Lymphocytes Relative: 35 % (ref 12–46)
Lymphs Abs: 2.4 10*3/uL (ref 0.7–4.0)
MCH: 27.8 pg (ref 26.0–34.0)
MCHC: 33 g/dL (ref 30.0–36.0)
MCV: 84.2 fL (ref 78.0–100.0)
MPV: 10.8 fL (ref 8.6–12.4)
Monocytes Absolute: 0.3 10*3/uL (ref 0.1–1.0)
Monocytes Relative: 5 % (ref 3–12)
NEUTROS PCT: 55 % (ref 43–77)
Neutro Abs: 3.8 10*3/uL (ref 1.7–7.7)
PLATELETS: 278 10*3/uL (ref 150–400)
RBC: 4.82 MIL/uL (ref 3.87–5.11)
RDW: 13.7 % (ref 11.5–15.5)
WBC: 6.9 10*3/uL (ref 4.0–10.5)

## 2015-05-30 LAB — LIPID PANEL
CHOL/HDL RATIO: 4.1 ratio (ref <=5.0)
Cholesterol: 239 mg/dL — ABNORMAL HIGH (ref 125–200)
HDL: 58 mg/dL (ref >=46)
LDL Cholesterol: 153 mg/dL — ABNORMAL HIGH (ref <130)
Triglycerides: 142 mg/dL (ref <150)
VLDL: 28 mg/dL (ref <30)

## 2015-05-30 LAB — TSH: TSH: 1.494 u[IU]/mL (ref 0.350–4.500)

## 2015-05-30 MED ORDER — NIACIN ER (ANTIHYPERLIPIDEMIC) 500 MG PO TBCR: EXTENDED_RELEASE_TABLET | ORAL | Status: DC

## 2015-05-30 MED ORDER — PANTOPRAZOLE SODIUM 20 MG PO TBEC: 20.0000 mg | DELAYED_RELEASE_TABLET | Freq: Every day | ORAL | Status: DC

## 2015-05-30 MED ORDER — TRAZODONE HCL 50 MG PO TABS: 25.0000 mg | ORAL_TABLET | Freq: Every evening | ORAL | Status: DC | PRN

## 2015-05-30 MED ORDER — DICYCLOMINE HCL 20 MG PO TABS: ORAL_TABLET | ORAL | Status: DC

## 2015-05-30 NOTE — Patient Instructions (Addendum)
Trazadone at bedtime for sleep  Take protonix twice a day  Take the bentyl as needed Take the ultram for headache  Monitor return on Monday  I will call with lab results  Referral to eye doctor Flu shot given F/U 2 weeks

## 2015-05-30 NOTE — Telephone Encounter (Signed)
[facility] Dr. Suzie Portelahristopher Groat,MD with referral number [id number]  Number of visits:6  Start date: 05/30/15  End date: 11/27/15  Dx: H53.9- unspecified visual disturbance  Treating provider: Suzie Portelahristopher Groat,MD

## 2015-05-30 NOTE — Progress Notes (Signed)
Patient ID: Lindsey FlockLuz Singleton, female   DOB: [birth year], 44 y.o.   MRN: [id number]   Subjective:    Patient ID: Lindsey FlockLuz Singleton, female    DOB: [birth year], 44 y.o.   MRN: [id number]   Patient presents for Frequent HA; Chest Pain; and GI Upset  Pt here with chest pain for the past 2 weeks, on and off, associated with heavy breathing and headache. No N/V, no diaphoresis. Pain is substernal and non radiating. She also gets palpitations on and off which have been worse in the past couple weeks.  She has been under a lot of stress, her husband has lung cancer and has been very ill recently. She does not sleep very well. She feels anxious all the time. She also gets headaches when she is stressed out. She does not feel like she is taking care of herself because she is trying to take care of her husband and the rest of the family.  She has also been having recurrent abdominal pain, followed by GI diagnosed with gastritis, Funtional dyspepsia, was on bentyl QHS, protonix and Elavil( she has not been taking this it caused severe drowsiness)   End of visit asked for eye doctor referral    Review Of Systems:  GEN- denies fatigue, fever, weight loss,weakness, recent illness HEENT- denies eye drainage, +change in vision, nasal discharge, CVS- + chest pain, palpitations RESP- denies SOB, cough, wheeze ABD- denies N/V, change in stools, abd pain GU- denies dysuria, hematuria, dribbling, incontinence MSK- denies joint pain, muscle aches, injury Neuro- + headache, dizziness, syncope, seizure activity       Objective:    BP 128/74 mmHg  Pulse 84  Temp(Src) 98.1 F (36.7 C) (Oral)  Resp 16  Ht 5' (1.524 m)  Wt 170 lb (77.111 kg)  BMI 33.20 kg/m2 GEN- NAD, alert and oriented x3 HEENT- PERRL, EOMI, non injected sclera, pink conjunctiva, MMM, oropharynx clear Neck- Supple, no thyromegaly CVS- RRR, no murmur RESP-CTAB ABD-NABS,soft,NT,ND EXT- No edema Neuro- CNII-XII intact Psych- Tearful and stressed  appearing, no SI, good eye contact, well groomed, thought process normal  Pulses- Radial, 2+  EKG- NSR      Assessment & Plan:      Problem List Items Addressed This Visit    Hyperlipidemia   Relevant Medications   niacin (NIASPAN) 500 MG CR tablet   Other Relevant Orders   Lipid panel (Completed)   GERD (gastroesophageal reflux disease)   Relevant Medications   dicyclomine (BENTYL) 20 MG tablet   pantoprazole (PROTONIX) 20 MG tablet    Other Visit Diagnoses    Chest pain, unspecified chest pain type    -  Primary     This is likley a combination of GI with her dyspepsia and no meds and anxiety. She has some risk factors for heart disease, check labs, will place 24 hour holt    Relevant Orders    EKG 12-Lead (Completed)    CBC with Differential/Platelet (Completed)    Comprehensive metabolic panel (Completed)    TSH (Completed)    Functional dyspepsia        Relevant Medications    pantoprazole (PROTONIX) 20 MG tablet    Gastritis and gastroduodenitis        Relevant Medications    pantoprazole (PROTONIX) 20 MG tablet    Vision changes        Relevant Orders    Ambulatory referral to Ophthalmology    Need for prophylactic vaccination and inoculation against influenza  Relevant Orders    Flu Vaccine QUAD 36+ mos PF IM (Fluarix & Fluzone Quad PF) (Completed)       Note: This dictation was prepared with Dragon dictation along with smaller phrase technology. Any transcriptional errors that result from this process are unintentional.

## 2015-05-31 ENCOUNTER — Encounter: Payer: Self-pay | Admitting: Family Medicine

## 2015-05-31 NOTE — Assessment & Plan Note (Signed)
Significant stress, her husband is dying from lung cancer, they have young children. She is not sleeping well or caring for herself Will try Trazodone, I think if we can improve mood and sleep, HA and other somatic compliants will decrease

## 2015-05-31 NOTE — Assessment & Plan Note (Signed)
Increase protonix to 40mg  BID per GI Restart bentyl

## 2015-06-05 ENCOUNTER — Encounter: Payer: Self-pay | Admitting: *Deleted

## 2015-06-06 ENCOUNTER — Encounter: Payer: Self-pay | Admitting: *Deleted

## 2015-06-06 ENCOUNTER — Telehealth: Payer: Self-pay | Admitting: *Deleted

## 2015-06-06 NOTE — Telephone Encounter (Signed)
Received Holter monitor report.   MD reviewed and states the following: No Afib No abnormal pauses No significant PVC Sinus arrhythmia  MD recommended that phone call be placed to patient via interpreter to state that monitor looks okay and palpitations should improve with anxiety medication. If palpitations continue, patient needs OV to eval.   Call placed to patient. LMTRC.   Letter sent to patient in Spanish.

## 2015-08-17 ENCOUNTER — Other Ambulatory Visit: Payer: Self-pay | Admitting: Physician Assistant

## 2015-08-17 NOTE — Telephone Encounter (Signed)
Refill appropriate and filled per protocol. 

## 2015-10-08 ENCOUNTER — Encounter: Payer: Self-pay | Admitting: Family Medicine

## 2015-10-08 ENCOUNTER — Ambulatory Visit (INDEPENDENT_AMBULATORY_CARE_PROVIDER_SITE_OTHER): Payer: 59 | Admitting: Family Medicine

## 2015-10-08 VITALS — BP 128/74 | HR 80 | Temp 98.2°F | Resp 16 | Ht 60.0 in | Wt 165.0 lb

## 2015-10-08 DIAGNOSIS — M25542 Pain in joints of left hand: Secondary | ICD-10-CM

## 2015-10-08 DIAGNOSIS — R1013 Epigastric pain: Secondary | ICD-10-CM | POA: Diagnosis not present

## 2015-10-08 DIAGNOSIS — K219 Gastro-esophageal reflux disease without esophagitis: Secondary | ICD-10-CM | POA: Diagnosis not present

## 2015-10-08 DIAGNOSIS — M25541 Pain in joints of right hand: Secondary | ICD-10-CM

## 2015-10-08 DIAGNOSIS — F4323 Adjustment disorder with mixed anxiety and depressed mood: Secondary | ICD-10-CM

## 2015-10-08 DIAGNOSIS — B009 Herpesviral infection, unspecified: Secondary | ICD-10-CM

## 2015-10-08 DIAGNOSIS — G5603 Carpal tunnel syndrome, bilateral upper limbs: Secondary | ICD-10-CM

## 2015-10-08 DIAGNOSIS — R079 Chest pain, unspecified: Secondary | ICD-10-CM

## 2015-10-08 LAB — TSH: TSH: 1.11 mIU/L

## 2015-10-08 LAB — CBC WITH DIFFERENTIAL/PLATELET
BASOS PCT: 1 %
Basophils Absolute: 83 cells/uL (ref 0–200)
EOS PCT: 3 %
Eosinophils Absolute: 249 cells/uL (ref 15–500)
HCT: 42.4 % (ref 35.0–45.0)
Hemoglobin: 14.2 g/dL (ref 12.0–15.0)
LYMPHS PCT: 29 %
Lymphs Abs: 2407 cells/uL (ref 850–3900)
MCH: 28 pg (ref 27.0–33.0)
MCHC: 33.5 g/dL (ref 32.0–36.0)
MCV: 83.6 fL (ref 80.0–100.0)
MONOS PCT: 4 %
MPV: 10.7 fL (ref 7.5–12.5)
Monocytes Absolute: 332 cells/uL (ref 200–950)
NEUTROS PCT: 63 %
Neutro Abs: 5229 cells/uL (ref 1500–7800)
PLATELETS: 261 10*3/uL (ref 140–400)
RBC: 5.07 MIL/uL (ref 3.80–5.10)
RDW: 13.7 % (ref 11.0–15.0)
WBC: 8.3 10*3/uL (ref 3.8–10.8)

## 2015-10-08 LAB — COMPREHENSIVE METABOLIC PANEL
ALT: 18 U/L (ref 6–29)
AST: 15 U/L (ref 10–30)
Albumin: 4.3 g/dL (ref 3.6–5.1)
Alkaline Phosphatase: 61 U/L (ref 33–115)
BILIRUBIN TOTAL: 0.4 mg/dL (ref 0.2–1.2)
BUN: 10 mg/dL (ref 7–25)
CALCIUM: 9.4 mg/dL (ref 8.6–10.2)
CO2: 24 mmol/L (ref 20–31)
CREATININE: 0.6 mg/dL (ref 0.50–1.10)
Chloride: 104 mmol/L (ref 98–110)
GLUCOSE: 82 mg/dL (ref 70–99)
Potassium: 4.4 mmol/L (ref 3.5–5.3)
SODIUM: 138 mmol/L (ref 135–146)
Total Protein: 7.3 g/dL (ref 6.1–8.1)

## 2015-10-08 LAB — LIPASE: Lipase: 12 U/L (ref 7–60)

## 2015-10-08 LAB — RHEUMATOID FACTOR: Rhuematoid fact SerPl-aCnc: <10 IU/mL (ref <=14)

## 2015-10-08 MED ORDER — PANTOPRAZOLE SODIUM 40 MG PO TBEC: 40.0000 mg | DELAYED_RELEASE_TABLET | Freq: Every day | ORAL | Status: DC

## 2015-10-08 MED ORDER — TRAMADOL HCL 50 MG PO TABS: 50.0000 mg | ORAL_TABLET | Freq: Four times a day (QID) | ORAL | Status: DC | PRN

## 2015-10-08 MED ORDER — VALACYCLOVIR HCL 500 MG PO TABS: 500.0000 mg | ORAL_TABLET | Freq: Every day | ORAL | Status: DC

## 2015-10-08 MED ORDER — ESCITALOPRAM OXALATE 10 MG PO TABS: 10.0000 mg | ORAL_TABLET | Freq: Every day | ORAL | Status: DC

## 2015-10-08 NOTE — Patient Instructions (Addendum)
Lexapro in the morning for anxiety and stress Take 1/2 pill trazodone if needed for sleep  Ultram for pain in hands  Take naprosyn for inflammation  We will call with labs  Take the protonix every day- stomach  F/U 4 weeks

## 2015-10-09 DIAGNOSIS — F4323 Adjustment disorder with mixed anxiety and depressed mood: Secondary | ICD-10-CM | POA: Insufficient documentation

## 2015-10-09 NOTE — Progress Notes (Signed)
Patient ID: Lindsey Singleton, female   DOB: 07/01/1971, 45 y.o.   MRN: [id number]   Subjective:    Patient ID: Lindsey Singleton, female    DOB: 07/01/1971, 45 y.o.   MRN: [id number]  Patient presents for Chest Pain Patient here with multiple symptoms. For the most part she's been having chest discomfort substernal as well as epigastric discomfort on and off for the past few months. This is worsened after her husband passed away. She did anxious she starts crying and that she has discomfort. She's been off of her regular medications and been taking them very intermittently for the past 3-4 months. She has known gastritis and severe GERD. She is taking trazodone but has not take every night and when she does she is only taking 12.5 mg. It does help her sleep. She states that she is now taking care of the children by herself she does not have any family here in this country she [place] not want to go back to [place] and she is fearful that the government will cause issues for her.  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 She was on tramadol for this and she is out.    Review Of Systems:  GEN- denies fatigue, fever, weight loss,weakness, recent illness HEENT- denies eye drainage, change in vision, nasal discharge, CVS- denies chest pain, palpitations RESP- denies SOB, cough, wheeze ABD- denies N/V, change in stools, abd pain GU- denies dysuria, hematuria, dribbling, incontinence MSK+joint pain, muscle aches, injury Neuro- denies headache, dizziness, syncope, seizure activity       Objective:    BP 128/74 mmHg  Pulse 80  Temp(Src) 98.2 F (36.8 C) (Oral)  Resp 16  Ht 5' (1.524 m)  Wt 165 lb (74.844 kg)  BMI 32.22 kg/m2 GEN- NAD, alert and oriented x3,walks with crutches  HEENT- PERRL, EOMI, non injected sclera, pink conjunctiva, MMM, oropharynx clear Neck- Supple, no thyromegaly CVS- RRR, no murmur, mild TTP across chest wall   RESP-CTAB ABD-NABS,soft,TTP epigastric region  EXT- No edema MSK- Bilat hands, no swelling, able to make fist  Psych- depressed, crying, not anxious, NO SI, good eye contact  Pulses- Radial, DP- 2+   EKG- NSR, no ST changes     Assessment & Plan:      Problem List Items Addressed This Visit    Herpes simplex type 2 infection   Relevant Medications   valACYclovir (VALTREX) 500 MG tablet   Hand joint pain   Relevant Orders   Rheumatoid factor (Completed)   GERD (gastroesophageal reflux disease)    History of severe GERD as well as Gastritis, restart protonix, which may be also contributing to pain      Relevant Medications   pantoprazole (PROTONIX) 40 MG tablet   Carpal tunnel syndrome    She has known arthritis in her aunt as well as symptoms of carpal tunnel however this time to concentrate on her anxiety and depression her abdominal pain and revisit this. For now she will just use her Naprosyn and her tramadol      Relevant Medications   escitalopram (LEXAPRO) 10 MG tablet   Adjustment reaction with anxiety and depression    Start lexapro in AM, continue trazodone in evening for sleep F/U 4 weeks We will work to see if there are some community resources to help with her family as well        Other Visit Diagnoses  Chest pain, unspecified    -  Primary    I think this is mediated by anxiety and loss of her husband, also underlying GI symptoms, EKG normal     Relevant Orders    EKG 12-Lead (Completed)    CBC with Differential/Platelet (Completed)    Comprehensive metabolic panel (Completed)    TSH (Completed)    Epigastric pain        Relevant Orders    Lipase (Completed)       Note: This dictation was prepared with Dragon dictation along with smaller phrase technology. Any transcriptional errors that result from this process are unintentional.

## 2015-10-09 NOTE — Assessment & Plan Note (Signed)
History of severe GERD as well as Gastritis, restart protonix, which may be also contributing to pain

## 2015-10-09 NOTE — Assessment & Plan Note (Signed)
She has known arthritis in her aunt as well as symptoms of carpal tunnel however this time to concentrate on her anxiety and depression her abdominal pain and revisit this. For now she will just use her Naprosyn and her tramadol

## 2015-10-10 NOTE — Assessment & Plan Note (Addendum)
Start lexapro in AM, continue trazodone in evening for sleep F/U 4 weeks We will work to see if there are some community resources to help with her family as well

## 2015-10-11 ENCOUNTER — Encounter: Payer: Self-pay | Admitting: *Deleted

## 2015-11-05 HISTORY — PX: OTHER SURGICAL HISTORY: SHX169

## 2015-11-06 ENCOUNTER — Ambulatory Visit: Payer: Medicaid Other | Admitting: Family Medicine

## 2015-11-17 ENCOUNTER — Encounter: Payer: Self-pay | Admitting: Family Medicine

## 2015-11-23 ENCOUNTER — Ambulatory Visit (INDEPENDENT_AMBULATORY_CARE_PROVIDER_SITE_OTHER): Payer: Medicaid Other | Admitting: Family Medicine

## 2015-11-23 ENCOUNTER — Encounter: Payer: Self-pay | Admitting: Family Medicine

## 2015-11-23 VITALS — BP 128/64 | HR 80 | Temp 98.1°F | Resp 14 | Ht 60.0 in | Wt 170.0 lb

## 2015-11-23 DIAGNOSIS — F4323 Adjustment disorder with mixed anxiety and depressed mood: Secondary | ICD-10-CM | POA: Diagnosis not present

## 2015-11-23 DIAGNOSIS — J069 Acute upper respiratory infection, unspecified: Secondary | ICD-10-CM | POA: Diagnosis not present

## 2015-11-23 DIAGNOSIS — E785 Hyperlipidemia, unspecified: Secondary | ICD-10-CM | POA: Diagnosis not present

## 2015-11-23 DIAGNOSIS — G5603 Carpal tunnel syndrome, bilateral upper limbs: Secondary | ICD-10-CM

## 2015-11-23 DIAGNOSIS — K219 Gastro-esophageal reflux disease without esophagitis: Secondary | ICD-10-CM | POA: Diagnosis not present

## 2015-11-23 LAB — LIPID PANEL
CHOL/HDL RATIO: 4.6 ratio (ref <=5.0)
Cholesterol: 257 mg/dL — ABNORMAL HIGH (ref 125–200)
HDL: 56 mg/dL (ref >=46)
LDL CALC: 171 mg/dL — AB (ref <130)
Triglycerides: 151 mg/dL — ABNORMAL HIGH (ref <150)
VLDL: 30 mg/dL (ref <30)

## 2015-11-23 MED ORDER — AZITHROMYCIN 250 MG PO TABS: ORAL_TABLET | ORAL | Status: DC

## 2015-11-23 NOTE — Progress Notes (Signed)
Patient ID: Lindsey Singleton, female   DOB: 1971-03-11, 45 y.o.   MRN: [id number]    Subjective:    Patient ID: Lindsey FlockLuz Singleton, female    DOB: 1971-03-11, 45 y.o.   MRN: [id number]  Patient presents for F/U and Illness Patient here for follow-up. Her last visit she had multiple complaints she is also suffering with depressed mood because her husband had passed away from lung cancer a few months prior. She was started on Lexapro in addition to the trazodone for her sleep.The trazodone even at decreased doses continue to cause daytime somnolence she even decreased down to 12.5 mg. She initially started with only a quarter of a tablet Lexapro because she was worried about side effects she is now taking 1 full tablet  She was having problems with acid reflux and her gastritis which is chronic she was restarted back on her pain protonix  Chronic arthritis of hands as well as some carpal tunnel symptoms she also has chronic ankle pain she was restarted back on her tramadol, would like to proceed with work up of carpal tunnel    Today she complains of sore throat and nasal congestion and cough for the past 5 days.sinus pressure and drainage, tenderness right sinus region, no fever, no OTC meds   Due for lipid panel - does not tolerate statins   Note I gave her some resources for food pantry also recommended that she try to sign up  for food stamps. She was also given resources for the [employer] for residents of [place] Her children are already getting resources from [employer]  Review Of Systems:  GEN- denies fatigue, fever, weight loss,weakness, recent illness HEENT- denies eye drainage, change in vision,+ nasal discharge, CVS- denies chest pain, palpitations RESP- denies SOB, cough, wheeze ABD- denies N/V, change in stools, abd pain GU- denies dysuria, hematuria, dribbling, incontinence MSK- + joint pain, muscle aches, injury Neuro- + headache, dizziness, syncope, seizure activity        Objective:    BP 128/64 mmHg  Pulse 80  Temp(Src) 98.1 F (36.7 C) (Oral)  Resp 14  Ht 5' (1.524 m)  Wt 170 lb (77.111 kg)  BMI 33.20 kg/m2  SpO2 98% GEN- NAD, alert and oriented x3 HEENT- PERRL, EOMI, non injected sclera, pink conjunctiva, MMM, oropharynx mild injection, TM clear bilat no effusion,  + maxillary sinus tenderness, inflammed turbinates,  Nasal drainage  Neck- Supple, no LAD CVS- RRR, no murmur RESP-CTAB Psych-tearful, not anxious appearing, no SI  EXT- No edema Pulses- Radial 2+         Assessment & Plan:      Problem List Items Addressed This Visit    Hyperlipidemia - Primary   Relevant Orders   Lipid panel   GERD (gastroesophageal reflux disease)    Improved with restarting PPI      Carpal tunnel syndrome    Nerve conduction to  Be done, continue ultram, NSAIDS as needed but limiting due to gastritis      Relevant Orders   Nerve conduction test   Adjustment reaction with anxiety and depression    Depression with insomnia, more difficulty with daytime somnolence even on low dose of trazodone Will have her try Lexapro at bedtime only        Other Visit Diagnoses    Acute URI        URI with sinsuitis- given zpak, mucinex DM    Relevant Medications    azithromycin (ZITHROMAX) 250 MG tablet  Note: This dictation was prepared with Dragon dictation along with smaller phrase technology. Any transcriptional errors that result from this process are unintentional.

## 2015-11-23 NOTE — Assessment & Plan Note (Signed)
Nerve conduction to  Be done, continue ultram, NSAIDS as needed but limiting due to gastritis

## 2015-11-23 NOTE — Assessment & Plan Note (Signed)
Improved with restarting PPI

## 2015-11-23 NOTE — Assessment & Plan Note (Signed)
Depression with insomnia, more difficulty with daytime somnolence even on low dose of trazodone Will have her try Lexapro at bedtime only

## 2015-11-23 NOTE — Patient Instructions (Addendum)
Take the lexapro at bedtime  Stop the trazodone for now  Use heating pad  Muscle rubs  Take antibiotic, use mucinex DM We will call with  Lab results Nerve conduction for hands  F/U 3 months- 30 minute

## 2015-11-29 ENCOUNTER — Encounter: Payer: Self-pay | Admitting: *Deleted

## 2015-11-29 ENCOUNTER — Other Ambulatory Visit: Payer: Self-pay | Admitting: *Deleted

## 2015-11-29 MED ORDER — FENOFIBRATE 54 MG PO TABS: 54.0000 mg | ORAL_TABLET | Freq: Every day | ORAL | Status: DC

## 2015-11-30 ENCOUNTER — Telehealth: Payer: Self-pay | Admitting: *Deleted

## 2015-11-30 NOTE — Telephone Encounter (Signed)
Received request from pharmacy for PA on Fnofibrate.   Patient has failed therapy with statins and Zetia (adv rx: facial swelling, bone pai n, dry mouth).  PA submitted. Confirmation # [id number].  Dx: E78.5- HLD.

## 2015-12-07 MED ORDER — TRICOR 48 MG PO TABS: 48.0000 mg | ORAL_TABLET | Freq: Every day | ORAL | Status: DC

## 2015-12-07 NOTE — Telephone Encounter (Signed)
Received PA determination.   PA denied.   Insurance prefers name brand Tricor 48mg .   Prescription sent to pharmacy.

## 2015-12-19 ENCOUNTER — Emergency Department (HOSPITAL_COMMUNITY): Payer: Medicaid Other

## 2015-12-19 ENCOUNTER — Emergency Department (HOSPITAL_COMMUNITY)
Admission: EM | Admit: 2015-12-19 | Discharge: 2015-12-20 | Disposition: A | Discharge status: Home / Self Care | Payer: Medicaid Other | Attending: Emergency Medicine | Admitting: Emergency Medicine

## 2015-12-19 ENCOUNTER — Encounter (HOSPITAL_COMMUNITY): Payer: Self-pay | Admitting: Emergency Medicine

## 2015-12-19 DIAGNOSIS — E785 Hyperlipidemia, unspecified: Secondary | ICD-10-CM | POA: Insufficient documentation

## 2015-12-19 DIAGNOSIS — R0789 Other chest pain: Secondary | ICD-10-CM | POA: Insufficient documentation

## 2015-12-19 DIAGNOSIS — N189 Chronic kidney disease, unspecified: Secondary | ICD-10-CM | POA: Insufficient documentation

## 2015-12-19 DIAGNOSIS — R51 Headache: Secondary | ICD-10-CM | POA: Insufficient documentation

## 2015-12-19 DIAGNOSIS — R519 Headache, unspecified: Secondary | ICD-10-CM

## 2015-12-19 DIAGNOSIS — J45909 Unspecified asthma, uncomplicated: Secondary | ICD-10-CM | POA: Diagnosis not present

## 2015-12-19 LAB — BASIC METABOLIC PANEL
ANION GAP: 7 (ref 5–15)
BUN: 16 mg/dL (ref 6–20)
CALCIUM: 9.5 mg/dL (ref 8.9–10.3)
CO2: 24 mmol/L (ref 22–32)
Chloride: 103 mmol/L (ref 101–111)
Creatinine, Ser: 0.56 mg/dL (ref 0.44–1.00)
GLUCOSE: 85 mg/dL (ref 65–99)
POTASSIUM: 4 mmol/L (ref 3.5–5.1)
SODIUM: 134 mmol/L — AB (ref 135–145)

## 2015-12-19 LAB — CBC
HEMATOCRIT: 39.2 % (ref 36.0–46.0)
HEMOGLOBIN: 13.1 g/dL (ref 12.0–15.0)
MCH: 28.1 pg (ref 26.0–34.0)
MCHC: 33.4 g/dL (ref 30.0–36.0)
MCV: 84.1 fL (ref 78.0–100.0)
Platelets: 284 10*3/uL (ref 150–400)
RBC: 4.66 MIL/uL (ref 3.87–5.11)
RDW: 13.3 % (ref 11.5–15.5)
WBC: 9.5 10*3/uL (ref 4.0–10.5)

## 2015-12-19 LAB — TROPONIN I

## 2015-12-19 LAB — I-STAT TROPONIN, ED: TROPONIN I, POC: 0 ng/mL (ref 0.00–0.08)

## 2015-12-19 MED ORDER — KETOROLAC TROMETHAMINE 30 MG/ML IJ SOLN
30.0000 mg | Freq: Once | INTRAMUSCULAR | Status: AC
  Administered 2015-12-19: 30 mg via INTRAVENOUS
  Filled 2015-12-19: qty 1

## 2015-12-19 MED ORDER — SODIUM CHLORIDE 0.9 % IV BOLUS (SEPSIS)
1000.0000 mL | Freq: Once | INTRAVENOUS | Status: AC
  Administered 2015-12-19: 1000 mL via INTRAVENOUS

## 2015-12-19 MED ORDER — ONDANSETRON HCL 4 MG/2ML IJ SOLN
4.0000 mg | Freq: Once | INTRAMUSCULAR | Status: AC
  Administered 2015-12-19: 4 mg via INTRAVENOUS
  Filled 2015-12-19: qty 2

## 2015-12-19 NOTE — ED Notes (Signed)
Pt c/o left sided chest pain that hurts worse when she takes deep breath.

## 2015-12-19 NOTE — ED Provider Notes (Signed)
TIME SEEN: 11:25  CHIEF COMPLAINT: Chest pain  HPI:  Lindsey Singleton is a 45 y.o female with a PMHx of HLD, anxiety, GERD who presents to the Emergency Department complaining of constant, left sided chest pain onset this afternoon. Pain started this afternoon. Pt also reports a gradually worsening headache that started this morning. Pt states she started becoming diaphoretic and nauseas and vomited one time today. Pt states that her headache started before the chest pain. Pt states she thought her chest pain was due to anxiety and took Celexa today with no alleviation. Pt has not taken any OTC medication for pain. Pt states her chest pain is exacerbated by breathing and movement. No history of PE, DVT, exogenous estrogen use, fracture, surgery, trauma, hospitalization, prolonged travel. No lower extremity swelling or pain. No calf tenderness. Pt further denies any fever, numbness, weakness or tingling. Pt also states she is not a smoker no known history of CAD. She is not a smoker. States she has had a dry cough. Has had similar headaches in the past that improved with Tylenol. She did not try taking any Tylenol at home. She denies any head injury. Not on anticoagulation or antiplatelet agents.  PCP is Dr. Jeanice Lim  ROS: See HPI Constitutional: no fever  Eyes: no drainage  ENT: no runny nose   Cardiovascular:  Left sided chest pain  Resp: no SOB  GI: nausea, vomiting GU: no dysuria Integumentary: no rash  Allergy: no hives  Musculoskeletal: no leg swelling  Neurological: no slurred speech, no numbness, no weakness, no tingling ROS otherwise negative  PAST MEDICAL HISTORY/PAST SURGICAL HISTORY:  Past Medical History  Diagnosis Date  . Asthma   . Chronic kidney disease     recurrent pyelo  . Gastritis     no current medication  . Hyperlipidemia     no medication  . 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 Gastric polyps 08/07/2014    MEDICATIONS:  Prior to Admission medications    Medication Sig Start Date End Date Taking? Authorizing Provider  azithromycin (ZITHROMAX) 250 MG tablet Take 2 tablets x 1 day, then 1 tab daily for 4 days 11/23/15   Salley Scarlet, MD  dicyclomine (BENTYL) 20 MG tablet TAKE 1 TABLET BY MOUTH FOUR TIMES DAILY BEFORE MEALS AND EVERY NIGHT AT BEDTIME 05/30/15   Salley Scarlet, MD  escitalopram (LEXAPRO) 10 MG tablet Take 1 tablet (10 mg total) by mouth daily. 10/08/15   Salley Scarlet, MD  ketoconazole (NIZORAL) 2 % shampoo Apply 1 application topically. Reported on 11/23/2015 06/10/14   Historical Provider, MD  naproxen (NAPROSYN) 250 MG tablet TAKE 1 TABLET BY MOUTH TWICE DAILY WITH A MEAL 08/17/15   Salley Scarlet, MD  pantoprazole (PROTONIX) 40 MG tablet Take 1 tablet (40 mg total) by mouth daily before breakfast. 10/08/15   Salley Scarlet, MD  traMADol (ULTRAM) 50 MG tablet Take 1 tablet (50 mg total) by mouth every 6 (six) hours as needed. 10/08/15   Salley Scarlet, MD  TRICOR 48 MG tablet Take 1 tablet (48 mg total) by mouth daily. 12/07/15   Salley Scarlet, MD  UNABLE TO FIND Dispense BILATERAL WRIST SPLINTS.  CTS(carpal tunnel splint) wrist support 01/22/15   Dorena Bodo, PA-C  valACYclovir (VALTREX) 500 MG tablet Take 1 tablet (500 mg total) by mouth daily. 10/08/15   Salley Scarlet, MD    ALLERGIES:  Allergies  Allergen Reactions  . Cephalexin Hives and Itching  .  Simvastatin Other (See Comments)    Bone pain, dry mouth  . Gabapentin   . Pravastatin Swelling    Facial swelling and stomach pains  . Zetia [Ezetimibe]     Bone Pain    SOCIAL HISTORY:  Social History  Substance Use Topics  . Smoking status: Never Smoker   . Smokeless tobacco: Never Used  . Alcohol Use: No    FAMILY HISTORY: Family History  Problem Relation Age of Onset  . Heart disease Father   . Leukemia Brother   . Diabetes Maternal Aunt   . Diabetes Maternal Uncle   . Stomach cancer Maternal Uncle     EXAM: BP 109/91 mmHg  Pulse 76   Temp(Src) 98 F (36.7 C)  Resp 20  Ht 5\' 3"  (1.6 m)  Wt 175 lb (79.379 kg)  BMI 31.01 kg/m2  SpO2 97%  LMP 12/10/2015 CONSTITUTIONAL: Alert and oriented and responds appropriately to questions. Well-appearing; well-nourished, Afebrile, nontoxic-appearing HEAD: Normocephalic EYES: Conjunctivae clear, PERRL ENT: normal nose; no rhinorrhea; moist mucous membranes NECK: Supple, no meningismus, no LAD  CARD: RRR; S1 and S2 appreciated; no murmurs, no clicks, no rubs, no gallops CHEST:  Tender to palpation over the left chest wall without crepitus, ecchymosis or deformity. No rash or other lesions. No breast masses appreciated. RESP: Normal chest excursion without splinting or tachypnea; breath sounds clear and equal bilaterally; no wheezes, no rhonchi, no rales, no hypoxia or respiratory distress, speaking full sentences ABD/GI: Normal bowel sounds; non-distended; soft, non-tender, no rebound, no guarding, no peritoneal signs BACK:  The back appears normal and is non-tender to palpation, there is no CVA tenderness EXT: Normal ROM in all joints; non-tender to palpation; no edema; normal capillary refill; no cyanosis, no calf tenderness or swelling    SKIN: Normal color for age and race; warm; no rash NEURO: Moves all extremities equally, sensation to light touch intact diffusely, cranial nerves II through XII intact PSYCH: The patient's mood and manner are appropriate. Grooming and personal hygiene are appropriate.  MEDICAL DECISION MAKING: Patient here with headache that she describes as diffuse, throbbing. Has had similar headaches in the past. No neurologic deficits, headache injury, fever, meningismus. Doubt intracranial hemorrhage, meningitis or encephalitis. Will treat with Toradol, IV fluids, Zofran.  Patient also complaining of chest pain. Has been constant since this afternoon. No risk factors for ACS other than hyperlipidemia. No risk factors for PE other than age. She is not  tachycardic, tachypneic or hypoxic. Doubt dissection. Pain reproducible with palpation of her chest wall. Suspect this is musculoskeletal in nature. It is worse with movement and movement of the left arm. We'll reassess after Toradol. Her troponin is negative, chest x-ray clear.  ED PROGRESS: 12:45 AM  Pt reports her headache is almost completely gone and her chest pain is gone. Again suspect that her headache is typical of her previous headaches and doubt intracranial hemorrhage, infectious etiology. Suspect chest pain is secondary to musculoskeletal pain. I feel she is safe to be discharged. She has prescriptions for naproxen. Have advised her to take this medication for pain as needed. She has a PCP for follow-up. Discussed return precautions.    At this time, I do not feel there is any life-threatening condition present. I have reviewed and discussed all results (EKG, imaging, lab, urine as appropriate), exam findings with patient. I have reviewed nursing notes and appropriate previous records.  I feel the patient is safe to be discharged home without further emergent workup. Discussed usual  and customary return precautions. Patient and family (if present) verbalize understanding and are comfortable with this plan.  Patient will follow-up with their primary care provider. If they do not have a primary care provider, information for follow-up has been provided to them. All questions have been answered.     EKG Interpretation  Date/Time:  Wednesday December 19 2015 22:10:10 EDT Ventricular Rate:  68 PR Interval:  150 QRS Duration: 91 QT Interval:  396 QTC Calculation: 421 R Axis:     Text Interpretation:  Sinus rhythm No significant change since last tracing in 2012 Confirmed by Sherell Christoffel,  DO, Bonna Steury [phone]) on 12/19/2015 11:06:32 PM        I personally performed the services described in this documentation, which was scribed in my presence. The recorded information has been reviewed and is  accurate.   Layla Maw Beatric Fulop, DO 12/20/15 [phone]

## 2015-12-20 NOTE — ED Notes (Signed)
Pt alert & oriented x4, stable gait. Patient given discharge instructions, paperwork & prescription(s). Patient  instructed to stop at the registration desk to finish any additional paperwork. Patient verbalized understanding. Pt left department w/ no further questions. 

## 2015-12-20 NOTE — Discharge Instructions (Signed)
You may take your naproxen 500 mg twice a day as needed for chest pain or headache. You may alternate this with Tylenol 1000 mg every 6 hours as needed for pain.   Dolor en la pared torcica (Chest Wall Pain) El dolor en la pared torcica se produce en los huesos y los msculos del pecho o alrededor de Scientist, product/process developmentestos. A veces, una lesin Occupational psychologistcausa este dolor. En ocasiones, la causa puede ser desconocida. Este dolor puede durar varias semanas. INSTRUCCIONES PARA EL CUIDADO EN EL HOGAR  Est atento a cualquier cambio en los sntomas. Tome estas medidas para Acupuncturistaliviar el dolor:   Haga reposo como se lo haya indicado el mdico.   Evite las actividades que causan dolor. Estas pueden ser Charles Schwabaquellas que requieren el uso de los msculos del trax, los abdominales o los laterales para levantar objetos pesados.   Si se lo indican, aplique hielo sobre la zona dolorida:  Ponga el hielo en una bolsa plstica.  Coloque una toalla entre la piel y la bolsa de hielo.  Coloque el hielo durante 20minutos, 2 a 3veces por Futures traderda.  Tome los medicamentos de venta libre y los recetados solamente como se lo haya indicado el mdico.  No consuma productos que contengan tabaco, incluidos cigarrillos, tabaco de Theatre managermascar y Administrator, Civil Servicecigarrillos electrnicos. Si necesita ayuda para dejar de fumar, consulte al mdico.  Concurra a todas las visitas de control como se lo haya indicado el mdico. Esto es importante. SOLICITE ATENCIN MDICA SI:  Lance Mussiene fiebre.  El dolor de Weyauwegapecho empeora.  Aparecen nuevos sntomas. SOLICITE ATENCIN MDICA DE INMEDIATO SI:  Tiene nuseas o vmitos.  Berenice Primasranspira o tiene sensacin de desvanecimiento.  Tiene tos con flema (esputo) o expectora sangre al toser.  Le falta el aire.   Esta informacin no tiene Theme park managercomo fin reemplazar el consejo del mdico. Asegrese de hacerle al mdico cualquier pregunta que tenga.   Document Released: 08/04/2006 Document Revised: 03/14/2015 Elsevier Interactive Patient Education  2016 [employer]  Dolor de cabeza general sin causa (General Headache Without Cause) El dolor de cabeza es un dolor o Dentistmalestar que se siente en la zona de la cabeza o del cuello. Puede no tener una causa especfica. Hay muchas causas y tipos de dolores de Turkmenistancabeza. Los dolores de cabeza ms comunes son los siguientes:  Cefalea tensional.  Cefaleas migraosas.  Cefalea en brotes.  Cefaleas diarias crnicas. INSTRUCCIONES PARA EL CUIDADO EN EL HOGAR  Controle su afeccin para ver si hay cambios. Siga estos pasos para Scientist, physiologicalcontrolar la afeccin: Control del Reynolds Americandolor  Tome los medicamentos de venta libre y los recetados solamente como se lo haya indicado el mdico.  Cuando sienta dolor de cabeza acustese en un cuarto oscuro y tranquilo.  Si se lo indican, aplique hielo sobre la cabeza y la zona del cuello:  Ponga el hielo en una bolsa plstica.  Coloque una toalla entre la piel y la bolsa de hielo.  Coloque el hielo durante 20minutos, 2 a 3veces por Futures traderda.  Utilice una almohadilla trmica o tome una ducha con agua caliente para aplicar calor en la cabeza y la zona del cuello como se lo haya indicado el mdico.  Mantenga las luces tenues si le Liz Claibornemolesta las luces brillantes o sus dolores de cabeza empeoran. Comida y bebida  Mantenga un horario para las comidas.  Limite el consumo de bebidas alcohlicas.  Consuma menos cantidad de cafena o deje de tomarla. Instrucciones generales  Concurra a todas las visitas de control como se lo haya  indicado el mdico. Esto es importante.  Lleve un diario de los dolores de cabeza para Financial risk analyst qu factores pueden desencadenarlos. Por ejemplo, escriba los siguientes datos:  Lo que usted come y Estate agent.  Cunto tiempo duerme.  Algn cambio en su dieta o en los medicamentos.  Pruebe algunas tcnicas de relajacin, como los [place].  Limite el estrs.  Sintese con la espalda recta y no tense los msculos.  No consuma productos que contengan  tabaco, incluidos cigarrillos, tabaco de Theatre manager o cigarrillos electrnicos. Si necesita ayuda para dejar de fumar, consulte al mdico.  Haga actividad fsica habitualmente como se lo haya indicado el mdico.  Tenga un horario fijo para dormir. Duerma entre 7 y 9horas o la cantidad de horas que le haya recomendado el mdico. SOLICITE ATENCIN MDICA SI:   Los medicamentos no Materials engineer los sntomas.  Tiene un dolor de cabeza que es diferente del dolor de cabeza habitual.  Tiene nuseas o vmitos.  Tiene fiebre. SOLICITE ATENCIN MDICA DE INMEDIATO SI:   El dolor se hace cada vez ms intenso.  Ha vomitado repetidas veces.  Presenta rigidez en el cuello.  Sufre prdida de la visin.  Tiene problemas para hablar.  Siente dolor en el ojo o en el odo.  Presenta debilidad muscular o prdida del control muscular.  Pierde el equilibrio o tiene problemas para Advertising account planner.  Sufre mareos o se desmaya.  Se siente confundido.   Esta informacin no tiene Theme park manager el consejo del mdico. Asegrese de hacerle al mdico cualquier pregunta que tenga.   Document Released: 04/02/2005 Document Revised: 03/14/2015 Elsevier Interactive Patient Education [employer]

## 2016-01-21 ENCOUNTER — Encounter: Payer: Self-pay | Admitting: Family Medicine

## 2016-02-26 ENCOUNTER — Ambulatory Visit: Payer: Medicaid Other | Admitting: Family Medicine

## 2016-03-11 ENCOUNTER — Telehealth: Payer: Self-pay | Admitting: *Deleted

## 2016-03-11 DIAGNOSIS — G5603 Carpal tunnel syndrome, bilateral upper limbs: Secondary | ICD-10-CM

## 2016-03-11 NOTE — Telephone Encounter (Signed)
Received results of nerve conduction study from [facility].   MD reviewed and states that there is carpal tunnel in both wrists, worse on the right side.   NO given to refer to hand surgery for treatment.   Referral orders given.   Call placed to patient via interpreter ([phone]~ telephone). No answer. No VM.

## 2016-03-13 NOTE — Telephone Encounter (Signed)
Call placed to patient and patient made aware via interpreter.

## 2016-03-25 ENCOUNTER — Ambulatory Visit (INDEPENDENT_AMBULATORY_CARE_PROVIDER_SITE_OTHER): Payer: Medicaid Other | Admitting: Family Medicine

## 2016-03-25 VITALS — BP 118/78 | HR 80 | Temp 98.0°F | Resp 16 | Ht 60.0 in | Wt 177.0 lb

## 2016-03-25 DIAGNOSIS — Z23 Encounter for immunization: Secondary | ICD-10-CM

## 2016-03-25 DIAGNOSIS — K219 Gastro-esophageal reflux disease without esophagitis: Secondary | ICD-10-CM

## 2016-03-25 DIAGNOSIS — M7712 Lateral epicondylitis, left elbow: Secondary | ICD-10-CM | POA: Diagnosis not present

## 2016-03-25 DIAGNOSIS — F329 Major depressive disorder, single episode, unspecified: Secondary | ICD-10-CM | POA: Insufficient documentation

## 2016-03-25 DIAGNOSIS — F339 Major depressive disorder, recurrent, unspecified: Secondary | ICD-10-CM | POA: Diagnosis not present

## 2016-03-25 MED ORDER — DICYCLOMINE HCL 20 MG PO TABS: ORAL_TABLET | ORAL | 1 refills | Status: DC

## 2016-03-25 MED ORDER — DICLOFENAC SODIUM 75 MG PO TBEC: 75.0000 mg | DELAYED_RELEASE_TABLET | Freq: Two times a day (BID) | ORAL | 0 refills | Status: DC

## 2016-03-25 MED ORDER — FLUOXETINE HCL 10 MG PO TABS: 10.0000 mg | ORAL_TABLET | Freq: Every day | ORAL | 3 refills | Status: DC

## 2016-03-25 MED ORDER — NAPROXEN 250 MG PO TABS: ORAL_TABLET | ORAL | 1 refills | Status: DC

## 2016-03-25 MED ORDER — PANTOPRAZOLE SODIUM 40 MG PO TBEC: 40.0000 mg | DELAYED_RELEASE_TABLET | Freq: Every day | ORAL | 6 refills | Status: DC

## 2016-03-25 MED ORDER — TRAMADOL HCL 50 MG PO TABS: 50.0000 mg | ORAL_TABLET | Freq: Four times a day (QID) | ORAL | 1 refills | Status: DC | PRN

## 2016-03-25 MED ORDER — FLUOXETINE HCL 10 MG PO CAPS: 10.0000 mg | ORAL_CAPSULE | Freq: Every day | ORAL | 3 refills | Status: DC

## 2016-03-25 NOTE — Patient Instructions (Addendum)
Take diclofenac twice a day  Restart your protonix Take the the ultram for pain Take the prozac 10mg  at bedtime  Flu shot  Ice your elbow 10 minutes three times a day  You can get a tennis elbow brace from [facility] or [facility]  F/U 2 months

## 2016-03-25 NOTE — Progress Notes (Signed)
   Subjective:    Patient ID: Lindsey Singleton, female    DOB: 08-20-1970, 45 y.o.   MRN: [id number]  Patient presents for Elbow Pain and Medication Refill Interpreter present Diagnosed with carpal tunnel, advised surgery may be needed via phone, referral sent to hand surgeon on 9/5 She has appointment scheduled The meantime she has had swelling of her right elbow for the past few weeks she states it occurs after cleaning a lot and cooking a lot she remembers picking up a heavy pain in and subsequently after had severe pain and some swelling on the lateral aspect of her elbow. The pain moved into her forearm and sometimes a port as well She has not taken anything over-the-counter. She is out of her tramadol she is out of her proton pump inhibitor. She also stopped the Lexapro that she take that along with her proton aches and broke out in hives but she thinks that she needs something else to help with her depressed mood. In treating since her husband passed away She is caring for her children and they're doing fairly well.   Review Of Systems:  GEN- denies fatigue, fever, weight loss,weakness, recent illness HEENT- denies eye drainage, change in vision, nasal discharge, CVS- denies chest pain, palpitations RESP- denies SOB, cough, wheeze ABD- denies N/V, change in stools, abd pain GU- denies dysuria, hematuria, dribbling, incontinence MSK- + joint pain, muscle aches, injury Neuro- denies headache, dizziness, syncope, seizure activity       Objective:    BP 118/78   Pulse 80   Temp 98 F (36.7 C) (Oral)   Resp 16   Ht 5' (1.524 m)   Wt 177 lb (80.3 kg)   BMI 34.57 kg/m  GEN- NAD, alert and oriented x3 HEENT- PERRL, EOMI, non injected sclera, pink conjunctiva, MMM, oropharynx clear CVS- RRR, no murmur RESP-CTAB ABD-NABS,soft,NT,ND MSK- Right elbow swelling over lateral epicondyle, pain with rotation against resistant ,TTP over epicondyle, Improved ROM and pain with compression below  epicondyle  EXT- Left foot brace- chronic  Psych- normal affect and mood  Pulses- Radial  2+        Assessment & Plan:      Problem List Items Addressed This Visit    MDD (major depressive disorder) (HCC)    Try her on fluoxetine 10 mg instead,discussed medication Bring her back in 8 weeks to recheck      Relevant Medications   FLUoxetine (PROZAC) 10 MG capsule   GERD (gastroesophageal reflux disease)    Will need short-term NSAIDs but has history of gastritis and GERD we'll put her back on her pantoprazole      Relevant Medications   dicyclomine (BENTYL) 20 MG tablet   pantoprazole (PROTONIX) 40 MG tablet    Other Visit Diagnoses    Left lateral epicondylitis    -  Primary   ICE, discussed brace she can use, NSAIDS, as she is seeing ortho, they can inject if conservative treatment does not work, decrease repetitive activity with elb   Relevant Medications   traMADol (ULTRAM) 50 MG tablet   naproxen (NAPROSYN) 250 MG tablet   Encounter for immunization       Relevant Orders   Flu Vaccine QUAD 36+ mos IM (Completed)      Note: This dictation was prepared with Dragon dictation along with smaller phrase technology. Any transcriptional errors that result from this process are unintentional.

## 2016-03-26 ENCOUNTER — Encounter: Payer: Self-pay | Admitting: Family Medicine

## 2016-03-26 NOTE — Assessment & Plan Note (Signed)
Will need short-term NSAIDs but has history of gastritis and GERD we'll put her back on her pantoprazole

## 2016-03-26 NOTE — Assessment & Plan Note (Addendum)
Try her on fluoxetine 10 mg instead,discussed medication Bring her back in 8 weeks to recheck

## 2016-05-05 ENCOUNTER — Other Ambulatory Visit (HOSPITAL_COMMUNITY): Payer: Self-pay | Admitting: Neurology

## 2016-05-05 ENCOUNTER — Ambulatory Visit (HOSPITAL_COMMUNITY)
Admission: RE | Admit: 2016-05-05 | Discharge: 2016-05-05 | Disposition: A | Discharge status: Home / Self Care | Payer: Medicaid Other | Source: Ambulatory Visit | Attending: Neurology | Admitting: Neurology

## 2016-05-05 DIAGNOSIS — G8929 Other chronic pain: Secondary | ICD-10-CM | POA: Insufficient documentation

## 2016-05-05 DIAGNOSIS — M25521 Pain in right elbow: Principal | ICD-10-CM

## 2016-05-09 ENCOUNTER — Ambulatory Visit (HOSPITAL_COMMUNITY): Payer: Medicaid Other | Attending: Family Medicine | Admitting: Physical Therapy

## 2016-05-09 DIAGNOSIS — M25531 Pain in right wrist: Secondary | ICD-10-CM | POA: Diagnosis present

## 2016-05-09 DIAGNOSIS — M25532 Pain in left wrist: Secondary | ICD-10-CM | POA: Diagnosis not present

## 2016-05-09 DIAGNOSIS — M25521 Pain in right elbow: Secondary | ICD-10-CM | POA: Diagnosis present

## 2016-05-09 NOTE — Patient Instructions (Addendum)
Wrist Extensors    Elbow straight, palm down. Place other hand with thumb on underside of wrist and fingers on back of hand. Slowly bend wrist down until stretch is felt on top of forearm. Hold _5__ seconds. Repeat 10___ times per session. Do __2_ sessions per day.  Copyright  VHI. All rights reserved.  Wrist Flexors    Elbow straight, palm down. Grasp fingers with other hand and slowly bend wrist backward. Hold _5__ seconds. Repeat __10_ times per session. Do _3__ sessions per day.  Copyright  VHI. All rights reserved.  Wrist, AROM Flex / Extend    Elbows bent to 90 at sides, palms down. Use armrests on chair if more comfortable. Bend wrists downward. Then bend wrists upward. Hold each position _3__ seconds. Repeat _10__ times per session. Do __1_ sessions per day.  Copyright  VHI. All rights reserved.  Wrist Extension: Isometric    With left forearm resting palm down on thigh, resist upward movement of hand with other hand. Hold __5__ seconds. Relax. Repeat __10__ times per set. Do __1__ sets per session. Do __2__ sessions per day.  Copyright  VHI. All rights reserved.  Wrist Flexion: Isometric    With left forearm resting palm up on thigh, resist upward movement of hand with other hand. Hold _3___ seconds. Relax. Repeat _10___ times per set. Do _1___ sets per session. Do ____ sessions per day. 2 Copyright  VHI. All rights reserved.  Wrist Extension: Resisted   Week 3  With right palm down, ___2_ pound weight in hand, bend wrist up. Return slowly. Repeat __10__ times per set. Do _1___ sets per session. Do ___2_ sessions per day.  Copyright  VHI. All rights reserved.  Wrist Extension: Resisted    With right palm down, ____ pound weight in hand, bend wrist up. Return slowly. Repeat ____ times per set. Do ____ sets per session. Do ____ sessions per day.  Copyright  VHI. All rights reserved.

## 2016-05-09 NOTE — Therapy (Signed)
[facility] [address] [place], [place], [address] Phone: [phone]   Fax:  [phone]  Physical Therapy Evaluation  Patient Details  Name: Lindsey FlockLuz Singleton MRN: [id number] Date of Birth: 1970/11/11 Referring Provider: Milinda AntisKawanta Cutten  Encounter Date: 05/09/2016      PT End of Session - 05/09/16 1114    Visit Number 1   Number of Visits 1   Authorization Type medicaid   Authorization - Visit Number 1   Authorization - Number of Visits 1   PT Start Time 1040   PT Stop Time 1115   PT Time Calculation (min) 35 min   Activity Tolerance Patient tolerated treatment well   Behavior During Therapy [facility] for tasks assessed/performed      Past Medical History:  Diagnosis Date  . Asthma   . Chronic kidney disease    recurrent pyelo  . Gastric polyps 08/07/2014  . Gastritis    no current medication  . 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 Hyperlipidemia    no medication    Past Surgical History:  Procedure Laterality Date  . APPENDECTOMY    . CESAREAN SECTION  1992, 2005   x 2  . ESOPHAGOGASTRODUODENOSCOPY    . OPEN REDUCTION MALAR FRACTURE      There were no vitals filed for this visit.       Subjective Assessment - 05/09/16 1036    Subjective Lindsey Singleton is a 45 yo female who's statement is being interpreted by an interpreter.  She states that she has been using crutches for eight years following an injury for her left foot.  She went to therapy for five years for her left foot.  Over the years her hands began bothering her  but in the last few months her pain has been increasing.  The right hand has pain from the right elbow to her wrist; the left has pain in the wrist only.  [facility] in [facility] right [facility].  She does feel as if the injection has helped her pain.      Currently in Pain? Yes   Pain Score 7   can go as high as a 10 lowest is a 0   Pain Location Hand   Pain  Orientation Right;Left   Pain Descriptors / Indicators Aching   Pain Onset More than a month ago   Pain Frequency Constant   Aggravating Factors  using hands    Pain Relieving Factors nothing             [facility] PT Assessment - 05/09/16 0001      Assessment   Medical Diagnosis B hand pain pt states dx with carpal tunnel    Referring Provider [facility] [place]     Precautions   Precautions None     Restrictions   Weight Bearing Restrictions No     Balance Screen   Has the patient fallen in the past 6 months No   Has the patient had a decrease in activity level because of a fear of falling?  No   Is the patient reluctant to leave their home because of a fear of falling?  No     Prior Function   Level of Independence Independent with community mobility with device   Vocation Unemployed     ROM / Strength   AROM / PROM / Strength Strength     Strength   Strength Assessment Site Wrist;Hand  Right/Left Wrist Right;Left   Right Wrist Flexion 4-/5   Right Wrist Extension 4-/5   Left Wrist Flexion 5/5   Left Wrist Extension 5/5   Right/Left hand Right;Left   Right Hand Grip (lbs) 36  Pt is right handed    Left Hand Grip (lbs) 48                           PT Education - 05/09/16 1113    Education provided Yes   Education Details HEP   Person(s) Educated Patient   Methods Explanation;Handout   Comprehension Verbalized understanding          PT Short Term Goals - 05/09/16 1215      PT SHORT TERM GOAL #1   Title Pt to be given a HEP for pt to complete to decrease bilateral wrist pain to allow pt to complete housework in comfort   Time 1   Period Days   Status New                  Plan - 05/09/16 1115    Clinical Impression Statement Pt is a 45 yo female who has been on crutches for eight years due to continued pain following a fracture of her foot.  She had five years of therapy on her foot with no improvement.   For the past several  months she has been having increased wrist and elbow pain with the right side being greater than the left.  She has been referred to skilled physical therapy.  The pt insurance only allows one visit per year therefore the patient was educated on stretches and strengthening for her wrists.      Rehab Potential Fair   PT Frequency 1x / week   PT Duration --  1 week   PT Treatment/Interventions Patient/family education   PT Next Visit Plan discharge due to financial reasons.       Patient will benefit from skilled therapeutic intervention in order to improve the following deficits and impairments:  Pain, Decreased strength, Decreased range of motion  Visit Diagnosis: Pain in left wrist  Pain in right wrist  Pain in right elbow     Problem List Patient Active Problem List   Diagnosis Date Noted  . MDD (major depressive disorder) 03/25/2016  . Adjustment reaction with anxiety and depression 10/09/2015  . Osteoarthritis of both hands 01/22/2015  . Carpal tunnel syndrome 01/22/2015  . Osteoporosis 05/24/2014  . Cerumen impaction 03/29/2014  . Hand joint pain 12/26/2013  . Bilateral knee pain 12/26/2013  . OA (osteoarthritis) of foot 12/26/2013  . Breast pain 12/26/2013  . Irregular menses 10/25/2013  . 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 Hyperlipidemia   . Obesity 07/27/2013  . Seborrheic dermatitis of scalp 07/20/2013  . Gastritis 07/20/2013  . GERD (gastroesophageal reflux disease) 07/20/2013    Virgina Organynthia Daneesha Quinteros, PT CLT [phone] 05/09/2016, 12:59 PM  [facility] [address] [place], [place], [address] Phone: [phone]   Fax:  [phone]  Name: Lindsey FlockLuz Singleton MRN: [id number] Date of Birth: [birth year]

## 2016-05-26 ENCOUNTER — Ambulatory Visit: Payer: Medicaid Other | Admitting: Family Medicine

## 2016-06-18 ENCOUNTER — Encounter: Payer: Self-pay | Admitting: Family Medicine

## 2016-06-18 ENCOUNTER — Ambulatory Visit (INDEPENDENT_AMBULATORY_CARE_PROVIDER_SITE_OTHER): Payer: Medicaid Other | Admitting: Family Medicine

## 2016-06-18 VITALS — BP 110/70 | HR 68 | Temp 98.8°F | Resp 18 | Ht 60.0 in | Wt 180.0 lb

## 2016-06-18 DIAGNOSIS — J0100 Acute maxillary sinusitis, unspecified: Secondary | ICD-10-CM

## 2016-06-18 DIAGNOSIS — G43009 Migraine without aura, not intractable, without status migrainosus: Secondary | ICD-10-CM

## 2016-06-18 DIAGNOSIS — J069 Acute upper respiratory infection, unspecified: Secondary | ICD-10-CM | POA: Diagnosis not present

## 2016-06-18 LAB — INFLUENZA A AND B AG, IMMUNOASSAY
INFLUENZA A ANTIGEN: NOT DETECTED
INFLUENZA B ANTIGEN: NOT DETECTED

## 2016-06-18 MED ORDER — AMOXICILLIN 875 MG PO TABS: 875.0000 mg | ORAL_TABLET | Freq: Two times a day (BID) | ORAL | 0 refills | Status: DC

## 2016-06-18 MED ORDER — FLUTICASONE PROPIONATE 50 MCG/ACT NA SUSP: 2.0000 | Freq: Every day | NASAL | 6 refills | Status: DC

## 2016-06-18 MED ORDER — KETOROLAC TROMETHAMINE 60 MG/2ML IM SOLN
60.0000 mg | Freq: Once | INTRAMUSCULAR | Status: AC
  Administered 2016-06-18: 60 mg via INTRAMUSCULAR

## 2016-06-18 NOTE — Progress Notes (Signed)
   Subjective:    Patient ID: Lindsey Singleton, female    DOB: [birth year], 45 y.o.   MRN: [id number]  Patient presents for Illness (x10 days- sore throat, productive cough with yellow mucus and blood tinged sputum-, fever, body aches, chest pain with cough, HA- reports that throat and chest is worse with cough)   About 2-3 weeks ago she started with sore throat cough and body aches chills headache. Her headache progressed where she had tenderness on the right side of her head when she touched it. She also made that her vision seems a little blurry but had been having some vision changes where she felt "cross side" months before this. She did not take her temperature during the time. Her hips have been sick with similar illness. She's not had any nausea vomiting or diarrhea. She now is having significant mucus production nasal drainage postnasal drip. She has soreness across her chest and in her ribs from coughing. She took some over-the-counter medication Tea and some other type of cold and cough pill   Translator present  Review Of Systems:  GEN-+ fatigue, +fever, weight loss,weakness, recent illness HEENT- denies eye drainage, change in vision,+ nasal discharge, CVS- denies chest pain, palpitations RESP- denies SOB,+ cough, wheeze ABD- denies N/V, change in stools, abd pain GU- denies dysuria, hematuria, dribbling, incontinence MSK- denies joint pain, muscle aches, injury Neuro- + headachedenies , dizziness, syncope, seizure activity       Objective:    BP 110/70 (BP Location: Right Arm, Patient Position: Sitting, Cuff Size: Normal)   Pulse 68   Temp 98.8 F (37.1 C) (Oral)   Resp 18   Ht 5' (1.524 m)   Wt 180 lb (81.6 kg)   SpO2 98%   BMI 35.15 kg/m  GEN- NAD, alert and oriented x3 HEENT- PERRL, EOMI, non injected sclera, pink conjunctiva, MMM, oropharynx mild injection, TM clear bilat no effusion,  + maxillary sinus tenderness, inflammed turbinates,  Nasal drainage  Neck- Supple,+  submandibular and shotty cervical  LAD CVS- RRR, no murmur RESP-CTAB Neuro- CNIIX-II in tact, fundus benign, normal eye chart screen, no focal deficits  Pulses- Radial 2+         Assessment & Plan:      Problem List Items Addressed This Visit    None    Visit Diagnoses    Acute non-recurrent maxillary sinusitis    -  Primary   Treat with amoxicillin,flonase, OTC robitussin or delsym, flu negative, also wit URI viral now progressed with sinusitis bacterial   Relevant Medications   amoxicillin (AMOXIL) 875 MG tablet   fluticasone (FLONASE) 50 MCG/ACT nasal spray   Other Relevant Orders   Influenza A and B Ag, Immunoassay (Completed)   Acute URI       Migraine without aura and without status migrainosus, not intractable       Toradol given, vision screen normal , no red flags seen on exam with headache   Relevant Medications   ketorolac (TORADOL) injection 60 mg (Completed)      Note: This dictation was prepared with Dragon dictation along with smaller phrase technology. Any transcriptional errors that result from this process are unintentional.

## 2016-06-18 NOTE — Patient Instructions (Addendum)
Robitussin DM or Delsym over the counter  Antibiotics AMoxicillin for infection Flonase for nasal drainage  F/U 4 months for PHYSICAL

## 2016-07-06 IMAGING — MG MM DIGITAL SCREENING BILAT
4 series · 4 of 4 positions shown · non-contrast
Comparison: Previous exam(s).

CLINICAL DATA: Screening.

EXAM:
DIGITAL SCREENING BILATERAL MAMMOGRAM WITH CAD

[L MLO]
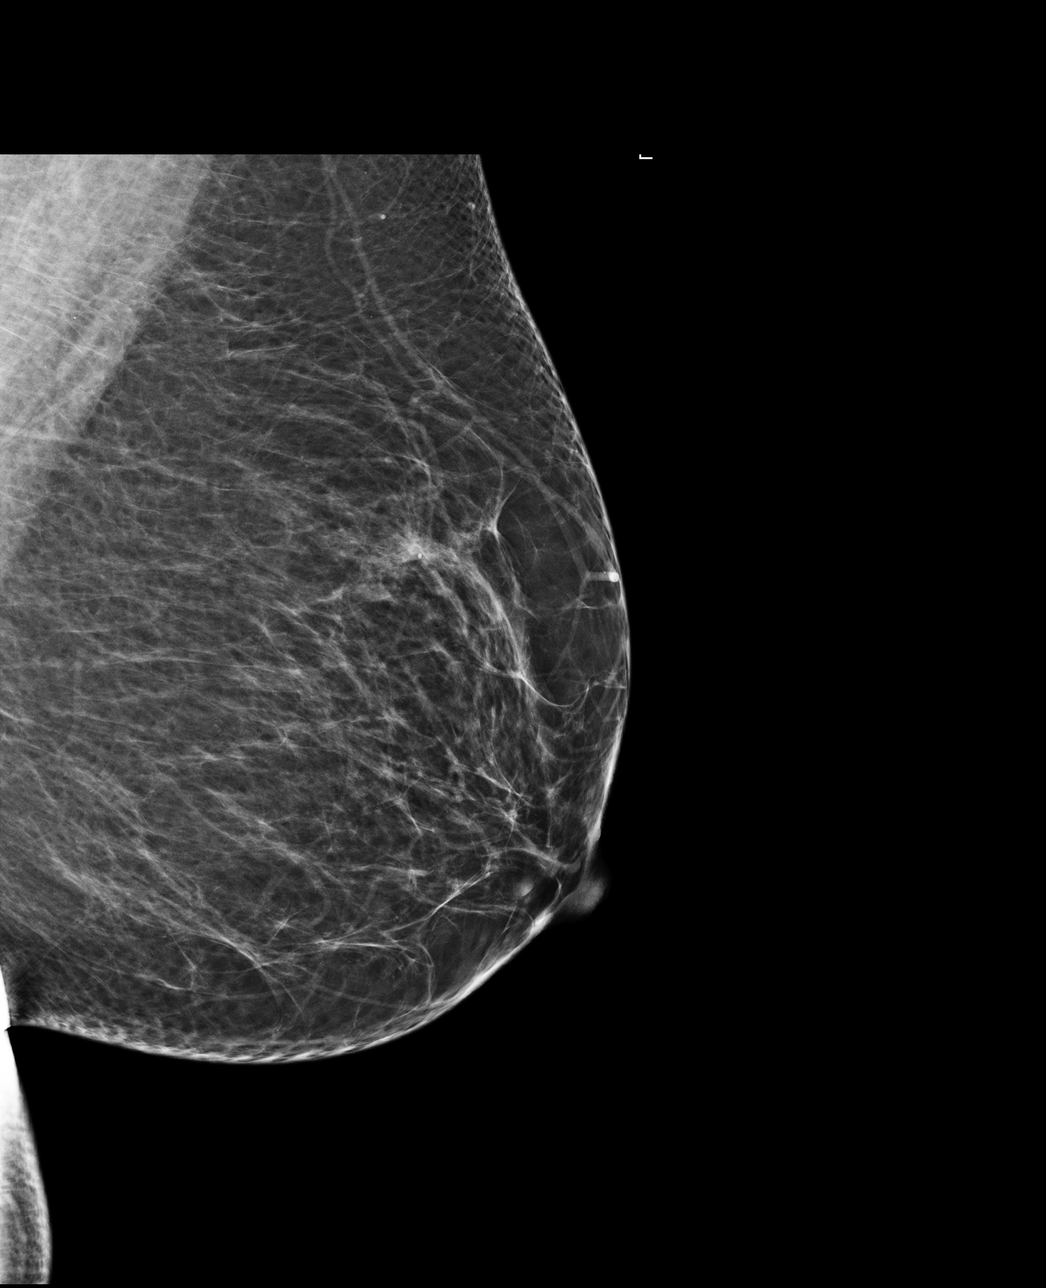

[R CC]
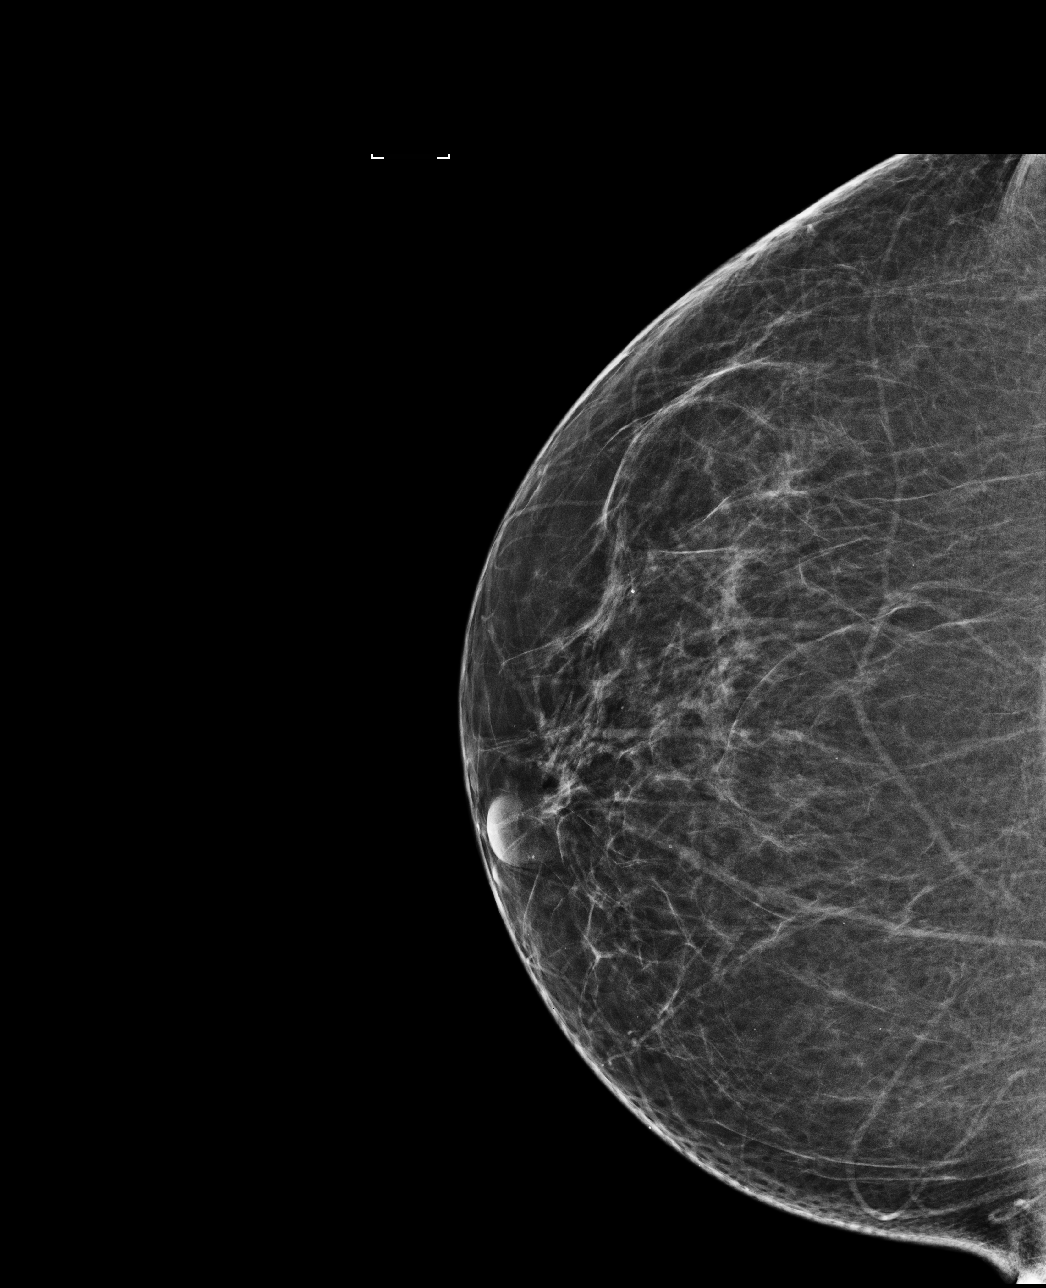

[L CC]
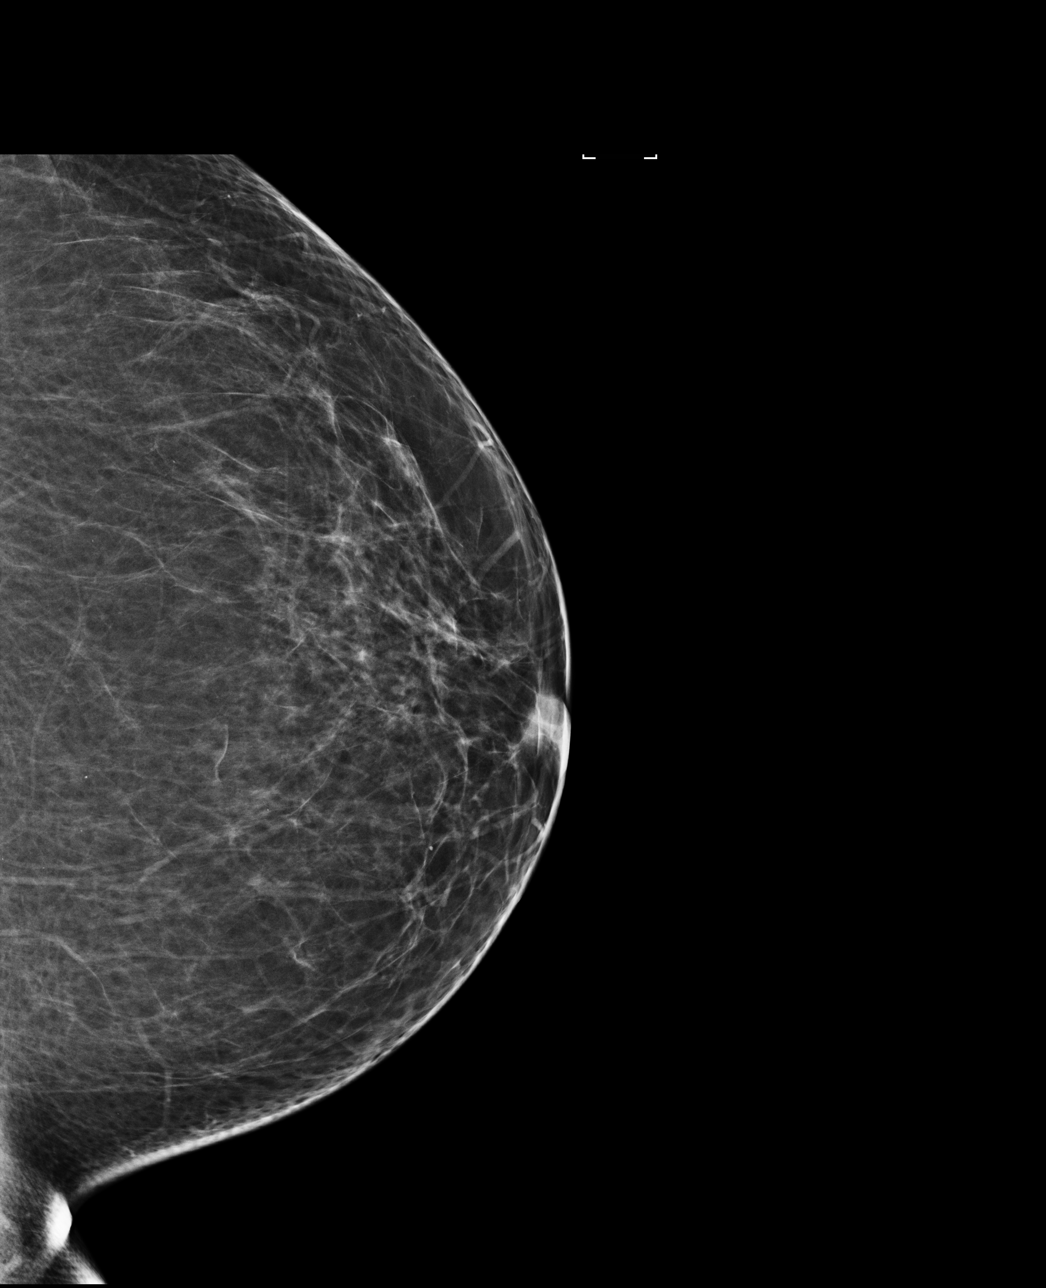

[R MLO]
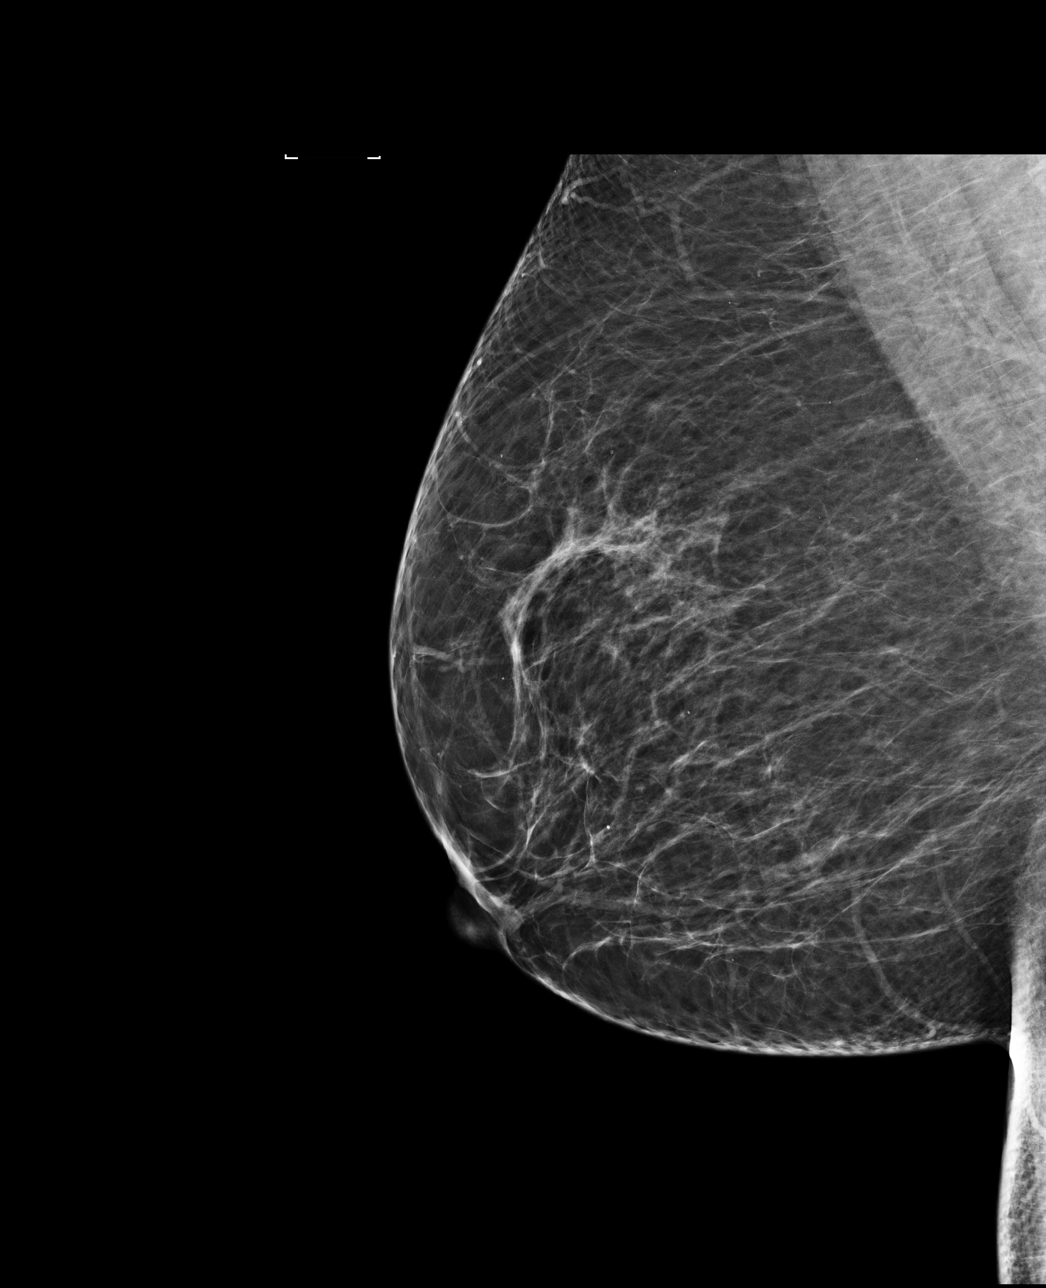

[4 of 4 positions shown; findings below may reference images not displayed]

ACR Breast Density Category b: There are scattered areas of
fibroglandular density.
FINDINGS: There are no findings suspicious for malignancy. Images were
processed with CAD.
IMPRESSION: No mammographic evidence of malignancy. A result letter of this
screening mammogram will be mailed directly to the patient.

RECOMMENDATION:
Screening mammogram in one year. (Code:AS-G-LCT)

BI-RADS CATEGORY  1: Negative.

## 2016-07-17 ENCOUNTER — Encounter: Payer: Self-pay | Admitting: Physician Assistant

## 2016-07-17 ENCOUNTER — Ambulatory Visit (INDEPENDENT_AMBULATORY_CARE_PROVIDER_SITE_OTHER): Payer: Medicaid Other | Admitting: Physician Assistant

## 2016-07-17 VITALS — BP 118/78 | HR 79 | Temp 97.4°F | Resp 18 | Wt 185.6 lb

## 2016-07-17 DIAGNOSIS — J020 Streptococcal pharyngitis: Secondary | ICD-10-CM

## 2016-07-17 DIAGNOSIS — J029 Acute pharyngitis, unspecified: Secondary | ICD-10-CM | POA: Diagnosis not present

## 2016-07-17 LAB — STREP GROUP A AG, W/REFLEX TO CULT: STREGTOCOCCUS GROUP A AG SCREEN: NOT DETECTED

## 2016-07-17 MED ORDER — AZITHROMYCIN 500 MG PO TABS: 500.0000 mg | ORAL_TABLET | Freq: Every day | ORAL | 0 refills | Status: DC

## 2016-07-17 NOTE — Progress Notes (Signed)
Patient ID: Lindsey Singleton MRN: [id number], DOB: [birth year], 46 y.o. Date of Encounter: 07/17/2016, 8:53 AM    Chief Complaint:  Chief Complaint  Patient presents with  . Sore Throat    hard to chew and eat x 4days      HPI: 46 y.o. year old female reasons with above.  Managed English interpreter/translator present.  Patient states that her throat has been sore for 4 days--- yesterday got really bad. Yesterday hurt very bad to eat and drink. Has had no drainage from the nose. Very little cough. No fever.     Home Meds:   Outpatient Medications Prior to Visit  Medication Sig Dispense Refill  . dicyclomine (BENTYL) 20 MG tablet TAKE 1 TABLET BY MOUTH FOUR TIMES DAILY BEFORE MEALS AND EVERY NIGHT AT BEDTIME 120 tablet 1  . FLUoxetine (PROZAC) 10 MG capsule Take 1 capsule (10 mg total) by mouth daily. 30 capsule 3  . fluticasone (FLONASE) 50 MCG/ACT nasal spray Place 2 sprays into both nostrils daily. 16 g 6  . naproxen (NAPROSYN) 250 MG tablet TAKE 1 TABLET BY MOUTH TWICE DAILY WITH A MEAL 60 tablet 1  . pantoprazole (PROTONIX) 40 MG tablet Take 1 tablet (40 mg total) by mouth daily before breakfast. 60 tablet 6  . traMADol (ULTRAM) 50 MG tablet Take 1 tablet (50 mg total) by mouth every 6 (six) hours as needed. 120 tablet 1  . TRICOR 48 MG tablet Take 1 tablet (48 mg total) by mouth daily. 30 tablet 3  . UNABLE TO FIND Dispense BILATERAL WRIST SPLINTS.  CTS(carpal tunnel splint) wrist support 2 each 0  . valACYclovir (VALTREX) 500 MG tablet Take 1 tablet (500 mg total) by mouth daily. 30 tablet 11  . amoxicillin (AMOXIL) 875 MG tablet Take 1 tablet (875 mg total) by mouth 2 (two) times daily. (Patient not taking: Reported on 07/17/2016) 20 tablet 0   No facility-administered medications prior to visit.     Allergies:  Allergies  Allergen Reactions  . Cephalexin Hives and Itching  . Simvastatin Other (See Comments)    Bone pain, dry mouth  . Gabapentin   . Pravastatin  Swelling    Facial swelling and stomach pains  . Zetia [Ezetimibe]     Bone Pain  . Lexapro [Escitalopram Oxalate] Rash      Review of Systems: See HPI for pertinent ROS. All other ROS negative.    Physical Exam: Blood pressure 118/78, pulse 79, temperature 97.4 F (36.3 C), temperature source Oral, resp. rate 18, weight 185 lb 9.6 oz (84.2 kg), last menstrual period 06/16/2016, SpO2 98 %., Body mass index is 36.25 kg/m. General:  Hispanic Female. Appears in no acute distress. HEENT: Normocephalic, atraumatic, eyes without discharge, sclera non-icteric, nares are without discharge. Bilateral auditory canals clear, TM's are without perforation, pearly grey and translucent with reflective cone of light bilaterally. Oral cavity moist, posterior pharynx with moderate erythema. No exudate, no peritonsillar abscess.  Neck: Supple. No thyromegaly. Reports that tenderness is worse on the right than the left. Reports significant tenderness with palpation of the right tonsillar nodes as well as anterior cervical nodes. Do not palpate significantly enlarged nodes. Lungs: Clear bilaterally to auscultation without wheezes, rales, or rhonchi. Breathing is unlabored. Heart: Regular rhythm. No murmurs, rubs, or gallops. Msk:  Strength and tone normal for age. Extremities/Skin: Warm and dry.  Neuro: Alert and oriented X 3. Moves all extremities spontaneously. Gait is normal. CNII-XII grossly in tact. Psych:  Responds to  questions appropriately with a normal affect.   Results for orders placed or performed in visit on 06/18/16  Influenza A and B Ag, Immunoassay  Result Value Ref Range   Source: NASAL    Influenza A Antigen Not Detected Not Detected   Influenza B Antigen Not Detected Not Detected     ASSESSMENT AND PLAN:  46 y.o. year old female with  1. Acute pharyngitis, unspecified etiology Rapid strep test negative but will go ahead and treat to cover strep. She is allergic to Keflex so will  use azithromycin. She is to take the azithromycin as directed and complete all of it. Can use lozenges spray Tylenol Motrin to help control the pain. f/U if symptoms do not resolve with completion of antibiotic. - azithromycin (ZITHROMAX) 500 MG tablet; Take 1 tablet (500 mg total) by mouth daily.  Dispense: 5 tablet; Refill: 0  2. Streptococcal sore throat - STREP GROUP A AG, W/REFLEX TO CULT   Signed, [address]Mary [address] Government Camp[place], [place], [facility] 07/17/2016 8:53 AM

## 2016-07-19 LAB — CULTURE, GROUP A STREP: Organism ID, Bacteria: NORMAL

## 2016-09-09 ENCOUNTER — Ambulatory Visit (INDEPENDENT_AMBULATORY_CARE_PROVIDER_SITE_OTHER): Payer: Medicaid Other | Admitting: Family Medicine

## 2016-09-09 ENCOUNTER — Encounter: Payer: Self-pay | Admitting: Family Medicine

## 2016-09-09 VITALS — BP 124/72 | HR 72 | Temp 98.3°F | Resp 14 | Ht 60.0 in | Wt 187.0 lb

## 2016-09-09 DIAGNOSIS — J029 Acute pharyngitis, unspecified: Secondary | ICD-10-CM

## 2016-09-09 DIAGNOSIS — K219 Gastro-esophageal reflux disease without esophagitis: Secondary | ICD-10-CM

## 2016-09-09 DIAGNOSIS — J0100 Acute maxillary sinusitis, unspecified: Secondary | ICD-10-CM

## 2016-09-09 MED ORDER — TRAMADOL HCL 50 MG PO TABS: 50.0000 mg | ORAL_TABLET | Freq: Four times a day (QID) | ORAL | 1 refills | Status: DC | PRN

## 2016-09-09 MED ORDER — FLUTICASONE PROPIONATE 50 MCG/ACT NA SUSP: 2.0000 | Freq: Every day | NASAL | 6 refills | Status: DC

## 2016-09-09 MED ORDER — AMOXICILLIN 875 MG PO TABS: 875.0000 mg | ORAL_TABLET | Freq: Two times a day (BID) | ORAL | 0 refills | Status: DC

## 2016-09-09 MED ORDER — DICYCLOMINE HCL 20 MG PO TABS: ORAL_TABLET | ORAL | 5 refills | Status: DC

## 2016-09-09 MED ORDER — FIRST-DUKES MOUTHWASH MT SUSP: OROMUCOSAL | 0 refills | Status: DC

## 2016-09-09 MED ORDER — PANTOPRAZOLE SODIUM 40 MG PO TBEC: 40.0000 mg | DELAYED_RELEASE_TABLET | Freq: Every day | ORAL | 6 refills | Status: DC

## 2016-09-09 NOTE — Patient Instructions (Addendum)
Antibiotics, nasal spray and mouth wash  F/U 3-4 months for PHYSICAL

## 2016-09-09 NOTE — Progress Notes (Signed)
   Subjective:    Patient ID: Lindsey Singleton, female    DOB: Mar 19, 1971, 46 y.o.   MRN: [id number]  Patient presents for Illness (x3 days- sinus pressure, HA, body aches, sore throat, nonproductive cough, fever/ chills)  Sore throat, sinus drainage, ear pain. No significant cough, started on Sunday. Last week felt a little a little sick  No vomiting, had a little diarrhea  No medications taken past few days     Review Of Systems:  GEN- denies fatigue, fever, weight loss,weakness, recent illness HEENT- denies eye drainage, change in vision, nasal discharge, CVS- denies chest pain, palpitations RESP- denies SOB, cough, wheeze ABD- denies N/V, change in stools, abd pain GU- denies dysuria, hematuria, dribbling, incontinence MSK- denies joint pain, muscle aches, injury Neuro- denies headache, dizziness, syncope, seizure activity       Objective:    BP 124/72   Pulse 72   Temp 98.3 F (36.8 C) (Oral)   Resp 14   Ht 5' (1.524 m)   Wt 187 lb (84.8 kg)   SpO2 99%   BMI 36.52 kg/m  GEN- NAD, alert and oriented x3 HEENT- PERRL, EOMI, non injected sclera, pink conjunctiva, MMM, oropharynx + injection, TM clear bilat no effusion,  + maxillary sinus tenderness, inflammed turbinates,  Nasal drainage  Neck- Supple, moderate cervical LAD CVS- RRR, no murmur RESP-CTAB EXT- No edema Pulses- Radial 2+           Assessment & Plan:      Problem List Items Addressed This Visit    GERD (gastroesophageal reflux disease)   Relevant Medications   dicyclomine (BENTYL) 20 MG tablet   pantoprazole (PROTONIX) 40 MG tablet    Other Visit Diagnoses    Acute non-recurrent maxillary sinusitis    -  Primary   sinsuitis, 2nd illness and significant pharngyitis, given amoxicillin, flonase, magic mouthwash   Relevant Medications   fluticasone (FLONASE) 50 MCG/ACT nasal spray   amoxicillin (AMOXIL) 875 MG tablet   Pharyngitis, unspecified etiology          Note: This dictation was prepared  with Dragon dictation along with smaller phrase technology. Any transcriptional errors that result from this process are unintentional.

## 2016-10-28 ENCOUNTER — Encounter: Payer: Self-pay | Admitting: Family Medicine

## 2016-10-28 ENCOUNTER — Other Ambulatory Visit: Payer: Self-pay | Admitting: Family Medicine

## 2016-10-28 ENCOUNTER — Ambulatory Visit (INDEPENDENT_AMBULATORY_CARE_PROVIDER_SITE_OTHER): Payer: Medicaid Other | Admitting: Family Medicine

## 2016-10-28 VITALS — BP 122/70 | HR 80 | Temp 98.5°F | Resp 14 | Ht 60.0 in | Wt 187.0 lb

## 2016-10-28 DIAGNOSIS — Z1231 Encounter for screening mammogram for malignant neoplasm of breast: Secondary | ICD-10-CM

## 2016-10-28 DIAGNOSIS — K219 Gastro-esophageal reflux disease without esophagitis: Secondary | ICD-10-CM

## 2016-10-28 DIAGNOSIS — Z0000 Encounter for general adult medical examination without abnormal findings: Secondary | ICD-10-CM | POA: Diagnosis not present

## 2016-10-28 DIAGNOSIS — J301 Allergic rhinitis due to pollen: Secondary | ICD-10-CM

## 2016-10-28 DIAGNOSIS — N760 Acute vaginitis: Secondary | ICD-10-CM | POA: Diagnosis not present

## 2016-10-28 DIAGNOSIS — L219 Seborrheic dermatitis, unspecified: Secondary | ICD-10-CM

## 2016-10-28 DIAGNOSIS — G5603 Carpal tunnel syndrome, bilateral upper limbs: Secondary | ICD-10-CM

## 2016-10-28 DIAGNOSIS — E782 Mixed hyperlipidemia: Secondary | ICD-10-CM

## 2016-10-28 DIAGNOSIS — Z1239 Encounter for other screening for malignant neoplasm of breast: Secondary | ICD-10-CM

## 2016-10-28 DIAGNOSIS — Z124 Encounter for screening for malignant neoplasm of cervix: Secondary | ICD-10-CM | POA: Diagnosis not present

## 2016-10-28 LAB — CBC WITH DIFFERENTIAL/PLATELET
BASOS PCT: 1 %
Basophils Absolute: 76 cells/uL (ref 0–200)
EOS ABS: 304 {cells}/uL (ref 15–500)
Eosinophils Relative: 4 %
HEMATOCRIT: 40.7 % (ref 35.0–45.0)
HEMOGLOBIN: 13.1 g/dL (ref 12.0–15.0)
LYMPHS ABS: 2280 {cells}/uL (ref 850–3900)
LYMPHS PCT: 30 %
MCH: 27 pg (ref 27.0–33.0)
MCHC: 32.2 g/dL (ref 32.0–36.0)
MCV: 83.9 fL (ref 80.0–100.0)
MONO ABS: 456 {cells}/uL (ref 200–950)
MPV: 10.5 fL (ref 7.5–12.5)
Monocytes Relative: 6 %
Neutro Abs: 4484 cells/uL (ref 1500–7800)
Neutrophils Relative %: 59 %
Platelets: 261 10*3/uL (ref 140–400)
RBC: 4.85 MIL/uL (ref 3.80–5.10)
RDW: 14 % (ref 11.0–15.0)
WBC: 7.6 10*3/uL (ref 3.8–10.8)

## 2016-10-28 LAB — COMPREHENSIVE METABOLIC PANEL
ALBUMIN: 4.1 g/dL (ref 3.6–5.1)
ALK PHOS: 60 U/L (ref 33–115)
ALT: 38 U/L — AB (ref 6–29)
AST: 24 U/L (ref 10–35)
BILIRUBIN TOTAL: 0.3 mg/dL (ref 0.2–1.2)
BUN: 11 mg/dL (ref 7–25)
CALCIUM: 9.5 mg/dL (ref 8.6–10.2)
CO2: 17 mmol/L — ABNORMAL LOW (ref 20–31)
Chloride: 104 mmol/L (ref 98–110)
Creat: 0.66 mg/dL (ref 0.50–1.10)
GLUCOSE: 95 mg/dL (ref 70–99)
POTASSIUM: 4.7 mmol/L (ref 3.5–5.3)
Sodium: 138 mmol/L (ref 135–146)
TOTAL PROTEIN: 7.1 g/dL (ref 6.1–8.1)

## 2016-10-28 LAB — LIPID PANEL
CHOLESTEROL: 260 mg/dL — AB (ref <200)
HDL: 55 mg/dL (ref >50)
LDL Cholesterol: 161 mg/dL — ABNORMAL HIGH (ref <100)
TRIGLYCERIDES: 219 mg/dL — AB (ref <150)
Total CHOL/HDL Ratio: 4.7 Ratio (ref <5.0)
VLDL: 44 mg/dL — ABNORMAL HIGH (ref <30)

## 2016-10-28 LAB — WET PREP FOR TRICH, YEAST, CLUE
CLUE CELLS WET PREP: NONE SEEN
TRICH WET PREP: NONE SEEN
Yeast Wet Prep HPF POC: NONE SEEN

## 2016-10-28 LAB — STREP GROUP A AG, W/REFLEX TO CULT: STREGTOCOCCUS GROUP A AG SCREEN: NOT DETECTED

## 2016-10-28 MED ORDER — KETOCONAZOLE 2 % EX SHAM: 1.0000 "application " | MEDICATED_SHAMPOO | CUTANEOUS | 2 refills | Status: AC

## 2016-10-28 NOTE — Assessment & Plan Note (Signed)
nizoral 2x per week

## 2016-10-28 NOTE — Progress Notes (Signed)
Subjective:    Patient ID: Lindsey Singleton, female    DOB: 1970/11/14, 46 y.o.   MRN: [id number]  Patient presents for CPE with PAP (is fasting)  Pt here for CPE, Interpreter used Due for PAP Smear, last normal in 2015 Due for Mammogram Hyperlipidemia- last LDL 171, she did not tolerate any of the cholesterol medications History of gastritis and GERD- takes protonix and bentyl for IBS like symptoms  Scalp- redness in scalp with flaking and small bumps tat pop upand go away, often sore to touch  Continues to have right arm/elbow pain- states she was suppose to have surgery but she was confused and had more questions was never contacted by their office,she wants to proceed with surgery  Review Of Systems:  GEN- denies fatigue, fever, weight loss,weakness, recent illness HEENT- denies eye drainage, change in vision, nasal discharge, CVS- denies chest pain, palpitations RESP- denies SOB, cough, wheeze ABD- denies N/V, change in stools, abd pain GU- denies dysuria, hematuria, dribbling, incontinence MSK- denies joint pain, muscle aches, injury Neuro- denies headache, dizziness, syncope, seizure activity       Objective:    BP 122/70   Pulse 80   Temp 98.5 F (36.9 C) (Oral)   Resp 14   Ht 5' (1.524 m)   Wt 187 lb (84.8 kg)   LMP 10/12/2016 Comment: regular  SpO2 98%   BMI 36.52 kg/m  GEN- NAD, alert and oriented x3 HEENT- PERRL, EOMI, non injected sclera, pink conjunctiva, MMM, oropharynx  mild erythema, nasal congestion, TM clear bilat  Neck- Supple, no thyromegaly Breast- normal symmetry, no nipple inversion,no nipple drainage, no nodules or lumps felt Nodes- no axillary nodes CVS- RRR, no murmur RESP-CTAB ABD-NABS,soft,NT,ND GU- normal external genitalia, vaginal mucosa pink and moist, cervix visualized no growth, no blood form os, minimal thin clear discharge, no CMT, no ovarian masses, uterus normal size Skin- fwe erythematous patchy with yellow and flesh toned  flakes  EXT- No edema Pulses- Radial, DP- 2+        Assessment & Plan:      Problem List Items Addressed This Visit    Seborrheic dermatitis of scalp    nizoral 2x per week       Hyperlipidemia    Check lipids      Relevant Orders   Lipid panel   GERD (gastroesophageal reflux disease)    Continue protonix and bentyl      Carpal tunnel syndrome    Call made to hand surgeon office myself, and she was to have surgery She has more questions there was definitely a disconnect She was scheduled for another appt with interpreter        Other Visit Diagnoses    Routine general medical examination at a health care facility    -  Primary   CPE done, labss, shot utd   Relevant Orders   CBC with Differential/Platelet   Comprehensive metabolic panel   Breast cancer screening       Mammo scheduled in office and pt notified via interpreter   Relevant Orders   MM DIGITAL SCREENING BILATERAL   Cervical cancer screening       Relevant Orders   PAP, ThinPrep ASCUS Rflx HPV Rflx Type   Seasonal allergic rhinitis due to pollen       Sore throat due to allergies draiange, can use anti-histamine, strep neg   Relevant Orders   STREP GROUP A AG, W/REFLEX TO CULT (Completed)   Acute vaginitis  Wet pre negative    Relevant Orders   Wet Prep for Trick, Yeast, Clue (Completed)      Note: This dictation was prepared with Dragon dictation along with smaller phrase technology. Any transcriptional errors that result from this process are unintentional.

## 2016-10-28 NOTE — Assessment & Plan Note (Signed)
Call made to hand surgeon office myself, and she was to have surgery She has more questions there was definitely a disconnect She was scheduled for another appt with interpreter

## 2016-10-28 NOTE — Assessment & Plan Note (Signed)
Continue protonix and bentyl

## 2016-10-28 NOTE — Assessment & Plan Note (Signed)
Check lipids 

## 2016-10-28 NOTE — Patient Instructions (Addendum)
Use Claritin or zyrtec over the counter for allergies  Dr. Melvyn Novas- May 10th 8:45am  Mammogram- Wed May 16th 8:20am We will call with lab results Use the NIZORAL Shampoo twice a week for your scalp  F/U 6 months    Use Claritin o zyrtec en el mostrador para alergias Dr. Melvyn Novas- 10 de mayo a las 8:45 a.m. Mamografa: mi 16 de mayo a las 8:20 a.m. Llamaremos con [employer] de laboratorio Use el champ NIZORAL dos veces por semana para su cuero cabelludo F / U 6 meses

## 2016-10-30 LAB — CULTURE, GROUP A STREP

## 2016-10-31 ENCOUNTER — Other Ambulatory Visit: Payer: Self-pay | Admitting: *Deleted

## 2016-10-31 LAB — PAP THINPREP ASCUS RFLX HPV RFLX TYPE

## 2016-10-31 MED ORDER — FENOFIBRATE 48 MG PO TABS: 48.0000 mg | ORAL_TABLET | Freq: Every day | ORAL | 3 refills | Status: DC

## 2016-11-19 ENCOUNTER — Other Ambulatory Visit: Payer: Self-pay | Admitting: Family Medicine

## 2016-11-19 ENCOUNTER — Ambulatory Visit
Admission: RE | Admit: 2016-11-19 | Discharge: 2016-11-19 | Disposition: A | Discharge status: Home / Self Care | Payer: Medicaid Other | Source: Ambulatory Visit | Attending: Family Medicine | Admitting: Family Medicine

## 2016-11-19 DIAGNOSIS — N63 Unspecified lump in unspecified breast: Secondary | ICD-10-CM

## 2016-11-19 DIAGNOSIS — Z1231 Encounter for screening mammogram for malignant neoplasm of breast: Secondary | ICD-10-CM

## 2016-11-19 DIAGNOSIS — R234 Changes in skin texture: Secondary | ICD-10-CM

## 2016-11-24 ENCOUNTER — Ambulatory Visit
Admission: RE | Admit: 2016-11-24 | Discharge: 2016-11-24 | Disposition: A | Discharge status: Home / Self Care | Payer: Medicaid Other | Source: Ambulatory Visit | Attending: Family Medicine | Admitting: Family Medicine

## 2016-11-24 DIAGNOSIS — N63 Unspecified lump in unspecified breast: Secondary | ICD-10-CM

## 2016-11-24 DIAGNOSIS — R234 Changes in skin texture: Secondary | ICD-10-CM

## 2017-04-04 ENCOUNTER — Other Ambulatory Visit: Payer: Self-pay | Admitting: Family Medicine

## 2017-04-04 DIAGNOSIS — K219 Gastro-esophageal reflux disease without esophagitis: Secondary | ICD-10-CM

## 2017-04-06 ENCOUNTER — Telehealth: Payer: Self-pay | Admitting: Family Medicine

## 2017-04-06 MED ORDER — TRAMADOL HCL 50 MG PO TABS: 50.0000 mg | ORAL_TABLET | Freq: Four times a day (QID) | ORAL | 1 refills | Status: AC | PRN

## 2017-04-06 NOTE — Congregational Nurse Program (Signed)
Congregational Nurse Program Note  Date of Encounter: 03/24/2017  Past Medical History: Past Medical History:  Diagnosis Date  . Asthma   . Chronic kidney disease    recurrent pyelo  . Gastric polyps 08/07/2014  . Gastritis    no current medication  . 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 Hyperlipidemia    no medication    Encounter Details:     CNP Questionnaire - 03/24/17 1100      Patient Demographics   Is this a new or existing patient? Existing   Patient is considered a/an Not Applicable   Race Latino/Hispanic     Patient Assistance   Location of Patient Assistance Not Applicable   Patient's financial/insurance status Low Income;Private Insurance Coverage   Uninsured Patient ([place] Card/Care Connects) No   Patient referred to apply for the following financial assistance Not Applicable   Food insecurities addressed Not Applicable   Transportation assistance No   Assistance securing medications No   Educational health offerings Not Applicable     Encounter Details   Primary purpose of visit Other;Safety   Was an Emergency Department visit averted? Not Applicable   Does patient have a medical provider? Yes   Patient referred to Follow up with established PCP   Was a mental health screening completed? (GAINS tool) No   Does patient have dental issues? No   Does patient have vision issues? Yes   Was a vision referral made? No   Does your patient have an abnormal blood pressure today? No   Since previous encounter, have you referred patient for abnormal blood pressure that resulted in a new diagnosis or medication change? No   Does your patient have an abnormal blood glucose today? No   Since previous encounter, have you referred patient for abnormal blood glucose that resulted in a new diagnosis or medication change? No   Was there a life-saving intervention made? No    Brief office visit to request blood pressure check due to possible concern about "hot  flashes". Feeling very warm and perspiring excessively today. B/P normal range. Denies chest pain or discomfort outside of left leg problem from healing fracture. Insurance expired. Refer for application to qualify for [employer]. Refer to [facility] for Breast and Pelvic Exam.  Follow-up with PCP once insurance established. Ferol Alexy, RN/CN

## 2017-04-06 NOTE — Congregational Nurse Program (Signed)
Congregational Nurse Program Note  Date of Encounter: 03/17/2017  Past Medical History: Past Medical History:  Diagnosis Date  . Asthma   . Chronic kidney disease    recurrent pyelo  . Gastric polyps 08/07/2014  . Gastritis    no current medication  . 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 Hyperlipidemia    no medication    Encounter Details:     CNP Questionnaire - 03/17/17 1130      Patient Demographics   Is this a new or existing patient? Existing   Patient is considered a/an Not Applicable   Race Latino/Hispanic     Patient Assistance   Location of Patient Assistance Not Applicable   Patient's financial/insurance status Low Income;Private Insurance Coverage   Uninsured Patient ([place] Card/Care Connects) No   Patient referred to apply for the following financial assistance Not Applicable   Food insecurities addressed Not Applicable   Transportation assistance No   Assistance securing medications No   Educational health offerings Not Applicable     Encounter Details   Primary purpose of visit Other;Safety   Was an Emergency Department visit averted? Not Applicable   Does patient have a medical provider? Yes   Patient referred to Follow up with established PCP   Was a mental health screening completed? (GAINS tool) No   Does patient have dental issues? No   Does patient have vision issues? Yes   Was a vision referral made? No   Does your patient have an abnormal blood pressure today? No   Since previous encounter, have you referred patient for abnormal blood pressure that resulted in a new diagnosis or medication change? No   Does your patient have an abnormal blood glucose today? No   Since previous encounter, have you referred patient for abnormal blood glucose that resulted in a new diagnosis or medication change? No   Was there a life-saving intervention made? No     Brief office visit for this Spanish speaking lady with limited English relating health  issues without interpreter. Referred from classroom teacher for reading glasses. Not wearing prescription eye glasses today. Walking with crutch due to previous fracture of left leg. Unsure about expiration date of medical insurance. Reading glasses, 1.25 corrected close vision to 20/20. Blood pressure normal. Referred to PCP for re-evaluation of leg and opthalmology referral. Return for information on medical insurance coverage if needed. Ferol Jaunita, RN/CN

## 2017-04-06 NOTE — Telephone Encounter (Signed)
Okay to refill? 

## 2017-04-06 NOTE — Telephone Encounter (Signed)
Pharm requesting a refill on Tramadol - [facility] -  09/09/16 #120/1  Ok to refill??

## 2017-04-06 NOTE — Telephone Encounter (Signed)
Medication called/sent to requested pharmacy  

## 2017-05-01 ENCOUNTER — Ambulatory Visit: Payer: Medicaid Other | Admitting: Family Medicine

## 2017-05-14 ENCOUNTER — Ambulatory Visit: Payer: Medicaid Other | Admitting: Physician Assistant

## 2017-05-14 ENCOUNTER — Encounter: Payer: Self-pay | Admitting: Physician Assistant

## 2017-05-14 ENCOUNTER — Other Ambulatory Visit: Payer: Self-pay

## 2017-05-14 VITALS — BP 118/80 | HR 75 | Temp 97.9°F | Resp 16 | Ht 60.0 in | Wt 186.8 lb

## 2017-05-14 DIAGNOSIS — R112 Nausea with vomiting, unspecified: Secondary | ICD-10-CM

## 2017-05-14 DIAGNOSIS — R1084 Generalized abdominal pain: Secondary | ICD-10-CM | POA: Diagnosis not present

## 2017-05-14 LAB — URINALYSIS, ROUTINE W REFLEX MICROSCOPIC
Bacteria, UA: NONE SEEN /HPF
Bilirubin Urine: NEGATIVE
GLUCOSE, UA: NEGATIVE
Leukocytes, UA: NEGATIVE
Nitrite: NEGATIVE
PH: 6 (ref 5.0–8.0)
Protein, ur: NEGATIVE
Specific Gravity, Urine: 1.025 (ref 1.001–1.03)
WBC UA: NONE SEEN /HPF (ref 0–5)

## 2017-05-14 LAB — CBC
HCT: 39.6 % (ref 35.0–45.0)
HEMOGLOBIN: 13.9 g/dL (ref 11.7–15.5)
MCH: 29.4 pg (ref 27.0–33.0)
MCHC: 35.1 g/dL (ref 32.0–36.0)
MCV: 83.9 fL (ref 80.0–100.0)
PLATELETS: 268 10*3/uL (ref 140–400)
RBC: 4.72 10*6/uL (ref 3.80–5.10)
RDW: 13.8 % (ref 11.0–15.0)
WBC: 8.2 10*3/uL (ref 3.8–10.8)

## 2017-05-14 LAB — MICROSCOPIC MESSAGE

## 2017-05-14 NOTE — Progress Notes (Signed)
Patient ID: Lindsey Singleton MRN: [id number], DOB: 19-Dec-1970, 46 y.o. Date of Encounter: @DATE @  Chief Complaint:  Chief Complaint  Patient presents with  . Abdominal Pain  . Emesis    after eating     HPI: 46 y.o. year old female  presents with above.   Interpreter/translator is present to revisit.  She states that the abdominal pain and vomiting started on Saturday night. She states that her children think that her symptoms are secondary to what she ate.  Says that she had eaten some pork and she is not sure if it was well done.  Also says that she ate something that was extremely spicy. However states that she does not know if anybody else being sick. States that on Saturday night she started to develop abdominal pain and vomiting.  Reports that the symptoms have been continuing.  Every time she would eat, 30 minutes after that she would vomit.  This continued through yesterday.   Today she has eaten no food and she has had no vomiting.  As well today the abdominal pain has been improved since she has not been eating.  She is feeling better. She has had no diarrhea. She has had no known fever or chills.    Past Medical History:  Diagnosis Date  . Asthma   . Chronic kidney disease    recurrent pyelo  . Gastric polyps 08/07/2014  . Gastritis    no current medication  . 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 Hyperlipidemia    no medication     Home Meds: Outpatient Medications Prior to Visit  Medication Sig Dispense Refill  . dicyclomine (BENTYL) 20 MG tablet TAKE 1 TABLET BY MOUTH FOUR TIMES DAILY EVERY NIGHT BEFORE MEALS AND AT BEDTIME 120 tablet 0  . ketoconazole (NIZORAL) 2 % shampoo Apply 1 application topically 2 (two) times a week. 120 mL 2  . pantoprazole (PROTONIX) 40 MG tablet TAKE 1 TABLET(40 MG) BY MOUTH DAILY BEFORE BREAKFAST 60 tablet 0  . traMADol (ULTRAM) 50 MG tablet Take 1 tablet (50 mg total) by mouth every 6 (six) hours as needed. 120 tablet 1  .  UNABLE TO FIND Dispense BILATERAL WRIST SPLINTS.  CTS(carpal tunnel splint) wrist support 2 each 0  . fenofibrate (TRICOR) 48 MG tablet Take 1 tablet (48 mg total) by mouth daily. (Patient not taking: Reported on 05/14/2017) 90 tablet 3  . FLUoxetine (PROZAC) 10 MG capsule TAKE 1 CAPSULE(10 MG) BY MOUTH DAILY (Patient not taking: Reported on 05/14/2017) 30 capsule 0   No facility-administered medications prior to visit.     Allergies:  Allergies  Allergen Reactions  . Cephalexin Hives and Itching  . Simvastatin Other (See Comments)    Bone pain, dry mouth  . Gabapentin   . Pravastatin Swelling    Facial swelling and stomach pains  . Zetia [Ezetimibe]     Bone Pain  . Lexapro [Escitalopram Oxalate] Rash    Social History   Socioeconomic History  . Marital status: Married    Spouse name: Not on file  . Number of children: 3  . Years of education: Not on file  . Highest education level: Not on file  Social Needs  . Financial resource strain: Not on file  . Food insecurity - worry: Not on file  . Food insecurity - inability: Not on file  . Transportation needs - medical: Not on file  . Transportation needs - non-medical: Not on file  Occupational  History  . Not on file  Tobacco Use  . Smoking status: Never Smoker  . Smokeless tobacco: Never Used  Substance and Sexual Activity  . Alcohol use: No  . Drug use: No  . Sexual activity: No    Birth control/protection: Condom  Other Topics Concern  . Not on file  Social History Narrative   Patient is from [place], he is married 3 children    Family History  Problem Relation Age of Onset  . Heart disease Father   . Leukemia Brother   . Diabetes Maternal Aunt   . Diabetes Maternal Uncle   . Stomach cancer Maternal Uncle      Review of Systems:  See HPI for pertinent ROS. All other ROS negative.    Physical Exam: Blood pressure 118/80, pulse 75, temperature 97.9 F (36.6 C), temperature source Oral, resp. rate 16,  height 5' (1.524 m), weight 84.7 kg (186 lb 12.8 oz), last menstrual period 05/08/2017, SpO2 98 %., Body mass index is 36.48 kg/m. General: Hispanic Female. Appears in no acute distress. Neck: Supple. No thyromegaly. No lymphadenopathy. Lungs: Clear bilaterally to auscultation without wheezes, rales, or rhonchi. Breathing is unlabored. Heart: RRR with S1 S2. No murmurs, rubs, or gallops. Abdomen: Soft,  non-distended with normoactive bowel sounds. No hepatomegaly. No rebound/guarding. No obvious abdominal masses.   She reports tenderness with palpation almost every area that I palpate.   However she is reporting less tenderness/pain with palpation towards the right lower quadrant.   She reports more pain tenderness with palpation of the right upper quadrant, of the left upper quadrant, and the left lower quadrant. However when she was sitting in the chair and on the exam table, she appears perfectly comfortable and does not appear in any distress at all.  However, as soon as I started palpating her abdomen, she started reporting discomfort. Musculoskeletal:  Strength and tone normal for age. Extremities/Skin: Warm and dry.  Neuro: Alert and oriented X 3. Moves all extremities spontaneously. Gait is normal. CNII-XII grossly in tact. Psych:  Responds to questions appropriately with a normal affect.     ASSESSMENT AND PLAN:  46 y.o. year old female with   1. Generalized abdominal pain Urinalysis and CBC results were obtained while patient in the office.  UA normal.   CBC also normal.  WBC is 8.2.  Hemoglobin 13.9. I recommend she stay with clear liquid diet x 48 hours then advance to bland diet.  If abdominal pain increases or vomiting re-occurs then follow up.  All of this information was translated to her from the translator interpreter.  Patient voices understanding and agrees. - CBC - Amylase - Lipase - COMPLETE METABOLIC PANEL WITH GFR - Urinalysis, Routine w reflex microscopic  2.  Non-intractable vomiting with nausea, unspecified vomiting type  - CBC - Amylase - Lipase - COMPLETE METABOLIC PANEL WITH GFR - Urinalysis, Routine w reflex microscopic   Signed, 15 Third RoadMary Beth San Juan Singleton, [place], [facility] 05/14/2017 2:45 PM

## 2017-05-15 LAB — COMPLETE METABOLIC PANEL WITH GFR
AG Ratio: 1.4 (calc) (ref 1.0–2.5)
ALBUMIN MSPROF: 4.1 g/dL (ref 3.6–5.1)
ALKALINE PHOSPHATASE (APISO): 63 U/L (ref 33–115)
ALT: 23 U/L (ref 6–29)
AST: 15 U/L (ref 10–35)
BUN: 11 mg/dL (ref 7–25)
CALCIUM: 9.4 mg/dL (ref 8.6–10.2)
CO2: 24 mmol/L (ref 20–32)
CREATININE: 0.57 mg/dL (ref 0.50–1.10)
Chloride: 103 mmol/L (ref 98–110)
GFR, EST NON AFRICAN AMERICAN: 111 mL/min/{1.73_m2} (ref >=60)
GFR, Est African American: 129 mL/min/{1.73_m2} (ref >=60)
GLOBULIN: 3 g/dL (ref 1.9–3.7)
GLUCOSE: 71 mg/dL (ref 65–99)
Potassium: 4.1 mmol/L (ref 3.5–5.3)
SODIUM: 136 mmol/L (ref 135–146)
Total Bilirubin: 0.4 mg/dL (ref 0.2–1.2)
Total Protein: 7.1 g/dL (ref 6.1–8.1)

## 2017-05-15 LAB — LIPASE: Lipase: 8 U/L (ref 7–60)

## 2017-05-15 LAB — AMYLASE: Amylase: 38 U/L (ref 21–101)

## 2017-07-21 NOTE — Progress Notes (Signed)
PCP:  Cardiologist:  EKG:  Stress test:  ECHO:  Cardiac Cath:  Chest x-ray 

## 2017-07-21 NOTE — Pre-Procedure Instructions (Signed)
Lindsey Singleton  07/21/2017      [facility] [phone] [place] - [address] AT [facility] [address] [place] [id number] Phone: [phone] Fax: [phone]    Your procedure is scheduled on July 23, 2017.  [facility] Admitting at 115 pm.  Call this number if you have problems the morning of surgery:  [phone]   Remember:  Do not eat food or drink liquids after midnight.  Take these medicines the morning of surgery with A SIP OF WATER acetaminophen (tylenol)-if needed, albuterol inhaler (bring inhaler with you), fluoxetine (prozac), fluticasone (flonase) nasal spray-if needed, dicyclomine (bentyl)-if needed, pantoprazole (protonix)-if needed, tramadol (ultram)-if needed for pain.  7 days prior to surgery STOP taking any Aspirin (unless otherwise instructed by your surgeon), Aleve, Naproxen, Ibuprofen, Motrin, Advil, Goody's, BC's, all herbal medications, fish oil, and all vitamins  Continue all other medications as instructed by your physician except follow the above medication instructions before surgery   Do not wear jewelry, make-up or nail polish.  Do not wear lotions, powders, or perfumes, or deodorant.  Do not shave 48 hours prior to surgery.   Do not bring valuables to the hospital.  [facility] is not responsible for any belongings or valuables.  Contacts, dentures or bridgework may not be worn into surgery.  Leave your suitcase in the car.  After surgery it may be brought to your room.  For patients admitted to the hospital, discharge time will be determined by your treatment team.  Patients discharged the day of surgery will not be allowed to drive home.   Special instructions:   Lindsey Singleton- Preparing For Surgery  Before surgery, you can play an important role. Because skin is not sterile, your skin needs to be as free of germs as possible. You can reduce the number of germs  on your skin by washing with CHG (chlorahexidine gluconate) Soap before surgery.  CHG is an antiseptic cleaner which kills germs and bonds with the skin to continue killing germs even after washing.  Please do not use if you have an allergy to CHG or antibacterial soaps. If your skin becomes reddened/irritated stop using the CHG.  Do not shave (including legs and underarms) for at least 48 hours prior to first CHG shower. It is OK to shave your face.  Please follow these instructions carefully.   1. Shower the NIGHT BEFORE SURGERY and the MORNING OF SURGERY with CHG.   2. If you chose to wash your hair, wash your hair first as usual with your normal shampoo.  3. After you shampoo, rinse your hair and body thoroughly to remove the shampoo.  4. Use CHG as you would any other liquid soap. You can apply CHG directly to the skin and wash gently with a scrungie or a clean washcloth.   5. Apply the CHG Soap to your body ONLY FROM THE NECK DOWN.  Do not use on open wounds or open sores. Avoid contact with your eyes, ears, mouth and genitals (private parts). Wash Face and genitals (private parts)  with your normal soap.  6. Wash thoroughly, paying special attention to the area where your surgery will be performed.  7. Thoroughly rinse your body with warm water from the neck down.  8. DO NOT shower/wash with your normal soap after using and rinsing off the CHG Soap.  9. Pat yourself dry with a CLEAN TOWEL.  10. Wear CLEAN PAJAMAS to bed the night before surgery, wear comfortable clothes the morning of surgery  11. Place CLEAN SHEETS on your bed the night of your first shower and DO NOT SLEEP WITH PETS.  Day of Surgery: Do not apply any deodorants/lotions. Please wear clean clothes to the hospital/surgery center.    Please read over the following fact sheets that you were given. Pain Booklet, Coughing and Deep Breathing and Surgical Site Infection Prevention

## 2017-07-22 ENCOUNTER — Encounter (HOSPITAL_COMMUNITY): Admission: RE | Admit: 2017-07-22 | Payer: Self-pay | Source: Ambulatory Visit

## 2017-07-23 ENCOUNTER — Ambulatory Visit (HOSPITAL_COMMUNITY): Admission: RE | Admit: 2017-07-23 | Payer: Self-pay | Source: Ambulatory Visit | Admitting: Orthopedic Surgery

## 2017-07-23 ENCOUNTER — Encounter (HOSPITAL_COMMUNITY): Admission: RE | Payer: Self-pay | Source: Ambulatory Visit

## 2017-07-23 SURGERY — TENNIS ELBOW RELEASE/NIRSCHEL PROCEDURE
Anesthesia: General | Laterality: Right

## 2017-09-15 NOTE — H&P (Addendum)
Lindsey Singleton is an 47 y.o. female.   Chief Complaint: RIGHT ELBOW LATERAL EPICONDYLITIS  HPI: The patient is a 47 year old right-hand dominant female who has been treated for right elbow lateral epicondylitis.  She has been treated with activity modification anti-inflammatories, and injections that have ceased to provide relief.  She would like to have the elbow fixed surgically. Discussed the surgical procedure, including the risks versus benefits, and the postoperative recovery. The patient is here today for surgery. She denies chest pain, shortness of breath, nausea, vomiting, diarrhea, fever, or chills.  Past Medical History:  Diagnosis Date  . Asthma   . Chronic kidney disease    recurrent pyelo  . Gastric polyps 08/07/2014  . Gastritis    no current medication  . 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 Hyperlipidemia    no medication    Past Surgical History:  Procedure Laterality Date  . APPENDECTOMY    . CESAREAN SECTION  1992, 2005   x 2  . ESOPHAGOGASTRODUODENOSCOPY    . OPEN REDUCTION MALAR FRACTURE      Family History  Problem Relation Age of Onset  . Heart disease Father   . Leukemia Brother   . Diabetes Maternal Aunt   . Diabetes Maternal Uncle   . Stomach cancer Maternal Uncle    Social History:  reports that  has never smoked. she has never used smokeless tobacco. She reports that she does not drink alcohol or use drugs.  Allergies:  Allergies  Allergen Reactions  . Cephalexin Hives and Itching  . Simvastatin Other (See Comments)    Bone pain, dry mouth  . Gabapentin     unknown  . Pravastatin Swelling    Facial swelling and stomach pains  . Zetia [Ezetimibe]     Bone Pain  . Lexapro [Escitalopram Oxalate] Rash    No medications prior to admission.    No results found for this or any previous visit (from the past 48 hour(s)). No results found.  ROS  NO RECENT ILLNESSES OR HOSPITALIZATIONS  There were no vitals taken for this  visit. Physical Exam  General Appearance:  Alert, cooperative, no distress, appears stated age  Head:  Normocephalic, without obvious abnormality, atraumatic  Eyes:  Pupils equal, conjunctiva/corneas clear,         Throat: Lips, mucosa, and tongue normal; teeth and gums normal  Neck: No visible masses     Lungs:   respirations unlabored  Chest Wall:  No tenderness or deformity  Heart:  Regular rate and rhythm,  Abdomen:   Soft, non-tender,         Extremities: RUE: No erythema, ecchymosis, or open wounds. Mild swelling of the right lateral epicondyle. Tenderness to palpation of the lateral epicondyle. Sensation intact to light touch. Pulses 2+ distally. Full flexion, extension, pronation, and supination of the elbow. Able to make a full fist, cross fingers, and abduct thumb.   Pulses: 2+ and symmetric  Skin: Skin color, texture, turgor normal, no rashes or lesions     Neurologic: Normal    Assessment RIGHT ELBOW LATERAL EPICONDYLITIS  Plan RIGHT ELBOW LATERAL EPICONDYLAR DEBRIDEMENT AND REPAIR AS INDICATED  R/B/A DISCUSSED WITH PT IN OFFICE.  PT VOICED UNDERSTANDING OF PLAN CONSENT SIGNED DAY OF SURGERY PT SEEN AND EXAMINED PRIOR TO OPERATIVE PROCEDURE/DAY OF SURGERY SITE MARKED. QUESTIONS ANSWERED WILL GO HOME FOLLOWING SURGERY  WE ARE PLANNING SURGERY FOR YOUR UPPER EXTREMITY. THE RISKS AND BENEFITS OF SURGERY INCLUDE BUT NOT LIMITED TO BLEEDING  INFECTION, DAMAGE TO NEARBY NERVES ARTERIES TENDONS, FAILURE OF SURGERY TO ACCOMPLISH ITS INTENDED GOALS, PERSISTENT SYMPTOMS AND NEED FOR FURTHER SURGICAL INTERVENTION. WITH THIS IN MIND WE WILL PROCEED. I HAVE DISCUSSED WITH THE PATIENT THE PRE AND POSTOPERATIVE REGIMEN AND THE DOS AND DON'TS. PT VOICED UNDERSTANDING AND INFORMED CONSENT SIGNED.    Karma Greaser 09/15/2017, 3:21 PM  R/B/A DISCUSSED WITH PT IN OFFICE.  PT VOICED UNDERSTANDING OF PLAN CONSENT SIGNED DAY OF SURGERY PT SEEN AND EXAMINED PRIOR TO  OPERATIVE PROCEDURE/DAY OF SURGERY SITE MARKED. QUESTIONS ANSWERED WILL GO HOME FOLLOWING SURGERY

## 2017-09-25 ENCOUNTER — Encounter (HOSPITAL_COMMUNITY): Payer: Self-pay | Admitting: *Deleted

## 2017-09-25 ENCOUNTER — Other Ambulatory Visit: Payer: Self-pay

## 2017-09-25 NOTE — Progress Notes (Addendum)
Through a [address], [name], [place] [id number], I spoke with Lindsey Singleton.  Patient reports that she has mid sternal chest pain at times,no radiation, no shortness of breath of breath or palpations not present at this time..  Patient states that the pain can be so strong that she can not move. patient reports that she had it a few years ago and they put a monitor.  I asked her if the Dr informed her of the results, she said "they say I work too hard, I need to relax."  I asked if she has seen a cardiologist, "no, I do not want to see anther Dr."  PCP is Dr.  Jeanice Limurham.  Dr Jeanice Limurham prescribed antianxiety medication after Heart Monitor, 05/2015.Marland Kitchen.  Patient states that she sometimes takes Tylenol for the pain, but it doesn't really help.  I asked patient if she has told her Dr that the pain continues, she said no.  Patient has been seen in the ED since 2016.  I asked patient if she took antianxiety medication, she said mo.  I asked if she every takes Protonix or Bentyl when she has the pain, patient said no.  I informed Dr Michelle Piperssey of above information, he ordered an EKG for DOS.

## 2017-09-28 ENCOUNTER — Encounter (HOSPITAL_COMMUNITY)
Admission: RE | Disposition: A | Discharge status: Home / Self Care | Payer: Self-pay | Source: Ambulatory Visit | Attending: Orthopedic Surgery

## 2017-09-28 ENCOUNTER — Ambulatory Visit (HOSPITAL_COMMUNITY)
Admission: RE | Admit: 2017-09-28 | Discharge: 2017-09-28 | Disposition: A | Discharge status: Home / Self Care | Payer: Medicaid Other | Source: Ambulatory Visit | Attending: Orthopedic Surgery | Admitting: Orthopedic Surgery

## 2017-09-28 ENCOUNTER — Other Ambulatory Visit: Payer: Self-pay

## 2017-09-28 ENCOUNTER — Encounter (HOSPITAL_COMMUNITY): Payer: Self-pay | Admitting: Orthopedic Surgery

## 2017-09-28 ENCOUNTER — Ambulatory Visit (HOSPITAL_COMMUNITY): Payer: Medicaid Other | Admitting: Anesthesiology

## 2017-09-28 DIAGNOSIS — Z806 Family history of leukemia: Secondary | ICD-10-CM | POA: Insufficient documentation

## 2017-09-28 DIAGNOSIS — M65831 Other synovitis and tenosynovitis, right forearm: Secondary | ICD-10-CM | POA: Diagnosis not present

## 2017-09-28 DIAGNOSIS — N189 Chronic kidney disease, unspecified: Secondary | ICD-10-CM | POA: Diagnosis not present

## 2017-09-28 DIAGNOSIS — E785 Hyperlipidemia, unspecified: Secondary | ICD-10-CM | POA: Insufficient documentation

## 2017-09-28 DIAGNOSIS — J45909 Unspecified asthma, uncomplicated: Secondary | ICD-10-CM | POA: Diagnosis not present

## 2017-09-28 DIAGNOSIS — Z8249 Family history of ischemic heart disease and other diseases of the circulatory system: Secondary | ICD-10-CM | POA: Insufficient documentation

## 2017-09-28 DIAGNOSIS — Z888 Allergy status to other drugs, medicaments and biological substances status: Secondary | ICD-10-CM | POA: Insufficient documentation

## 2017-09-28 DIAGNOSIS — M7711 Lateral epicondylitis, right elbow: Secondary | ICD-10-CM | POA: Insufficient documentation

## 2017-09-28 DIAGNOSIS — Z881 Allergy status to other antibiotic agents status: Secondary | ICD-10-CM | POA: Diagnosis not present

## 2017-09-28 DIAGNOSIS — Z79899 Other long term (current) drug therapy: Secondary | ICD-10-CM | POA: Insufficient documentation

## 2017-09-28 HISTORY — PX: TENNIS ELBOW RELEASE/NIRSCHEL PROCEDURE: SHX6651

## 2017-09-28 HISTORY — DX: Unspecified osteoarthritis, unspecified site: M19.90

## 2017-09-28 HISTORY — DX: Gastro-esophageal reflux disease without esophagitis: K21.9

## 2017-09-28 LAB — CBC
HCT: 38 % (ref 36.0–46.0)
Hemoglobin: 12.8 g/dL (ref 12.0–15.0)
MCH: 28.3 pg (ref 26.0–34.0)
MCHC: 33.7 g/dL (ref 30.0–36.0)
MCV: 84.1 fL (ref 78.0–100.0)
PLATELETS: 336 10*3/uL (ref 150–400)
RBC: 4.52 MIL/uL (ref 3.87–5.11)
RDW: 13.8 % (ref 11.5–15.5)
WBC: 8.9 10*3/uL (ref 4.0–10.5)

## 2017-09-28 LAB — BASIC METABOLIC PANEL
Anion gap: 10 (ref 5–15)
BUN: 11 mg/dL (ref 6–20)
CHLORIDE: 108 mmol/L (ref 101–111)
CO2: 20 mmol/L — AB (ref 22–32)
CREATININE: 0.59 mg/dL (ref 0.44–1.00)
Calcium: 9.2 mg/dL (ref 8.9–10.3)
GFR calc non Af Amer: >60 mL/min (ref >60)
GLUCOSE: 84 mg/dL (ref 65–99)
Potassium: 4.2 mmol/L (ref 3.5–5.1)
Sodium: 138 mmol/L (ref 135–145)

## 2017-09-28 LAB — POCT PREGNANCY, URINE: Preg Test, Ur: NEGATIVE

## 2017-09-28 SURGERY — TENNIS ELBOW RELEASE/NIRSCHEL PROCEDURE
Anesthesia: General | Site: Elbow | Laterality: Right

## 2017-09-28 MED ORDER — MIDAZOLAM HCL 5 MG/5ML IJ SOLN
INTRAMUSCULAR | Status: DC | PRN
  Administered 2017-09-28: 2 mg via INTRAVENOUS

## 2017-09-28 MED ORDER — CLINDAMYCIN PHOSPHATE 900 MG/50ML IV SOLN
900.0000 mg | INTRAVENOUS | Status: AC
  Administered 2017-09-28: 900 mg via INTRAVENOUS

## 2017-09-28 MED ORDER — LIDOCAINE 2% (20 MG/ML) 5 ML SYRINGE
INTRAMUSCULAR | Status: DC | PRN
  Administered 2017-09-28: 50 mg via INTRAVENOUS

## 2017-09-28 MED ORDER — CHLORHEXIDINE GLUCONATE 4 % EX LIQD: 60.0000 mL | Freq: Once | CUTANEOUS | Status: DC

## 2017-09-28 MED ORDER — ONDANSETRON HCL 4 MG/2ML IJ SOLN: 4.0000 mg | Freq: Once | INTRAMUSCULAR | Status: DC | PRN

## 2017-09-28 MED ORDER — ONDANSETRON HCL 4 MG/2ML IJ SOLN
INTRAMUSCULAR | Status: DC | PRN
  Administered 2017-09-28: 4 mg via INTRAVENOUS

## 2017-09-28 MED ORDER — OXYCODONE HCL 5 MG PO TABS: 5.0000 mg | ORAL_TABLET | Freq: Once | ORAL | Status: DC | PRN

## 2017-09-28 MED ORDER — MIDAZOLAM HCL 2 MG/2ML IJ SOLN
INTRAMUSCULAR | Status: AC
  Filled 2017-09-28: qty 2

## 2017-09-28 MED ORDER — BUPIVACAINE HCL (PF) 0.25 % IJ SOLN
INTRAMUSCULAR | Status: DC | PRN
  Administered 2017-09-28: 7 mL

## 2017-09-28 MED ORDER — HYDROMORPHONE HCL 1 MG/ML IJ SOLN
INTRAMUSCULAR | Status: AC
  Filled 2017-09-28: qty 1

## 2017-09-28 MED ORDER — 0.9 % SODIUM CHLORIDE (POUR BTL) OPTIME
TOPICAL | Status: DC | PRN
  Administered 2017-09-28: 1000 mL

## 2017-09-28 MED ORDER — LIDOCAINE HCL (CARDIAC) 20 MG/ML IV SOLN
INTRAVENOUS | Status: AC
  Filled 2017-09-28: qty 5

## 2017-09-28 MED ORDER — OXYCODONE HCL 5 MG/5ML PO SOLN: 5.0000 mg | Freq: Once | ORAL | Status: DC | PRN

## 2017-09-28 MED ORDER — HYDROMORPHONE HCL 1 MG/ML IJ SOLN
0.2500 mg | INTRAMUSCULAR | Status: DC | PRN
  Administered 2017-09-28 (×4): 0.5 mg via INTRAVENOUS

## 2017-09-28 MED ORDER — FENTANYL CITRATE (PF) 250 MCG/5ML IJ SOLN
INTRAMUSCULAR | Status: AC
Start: 2017-09-28 — End: 2017-09-28
  Filled 2017-09-28: qty 5

## 2017-09-28 MED ORDER — BUPIVACAINE HCL (PF) 0.25 % IJ SOLN
INTRAMUSCULAR | Status: AC
  Filled 2017-09-28: qty 30

## 2017-09-28 MED ORDER — FENTANYL CITRATE (PF) 100 MCG/2ML IJ SOLN
INTRAMUSCULAR | Status: DC | PRN
  Administered 2017-09-28: 50 ug via INTRAVENOUS
  Administered 2017-09-28: 100 ug via INTRAVENOUS

## 2017-09-28 MED ORDER — LACTATED RINGERS IV SOLN
INTRAVENOUS | Status: DC
  Administered 2017-09-28: 15:00:00 via INTRAVENOUS
  Administered 2017-09-28: 50 mL/h via INTRAVENOUS

## 2017-09-28 MED ORDER — FENTANYL CITRATE (PF) 250 MCG/5ML IJ SOLN
INTRAMUSCULAR | Status: AC
  Filled 2017-09-28: qty 5

## 2017-09-28 MED ORDER — DEXAMETHASONE SODIUM PHOSPHATE 10 MG/ML IJ SOLN
INTRAMUSCULAR | Status: DC | PRN
  Administered 2017-09-28: 10 mg via INTRAVENOUS

## 2017-09-28 MED ORDER — PROPOFOL 10 MG/ML IV BOLUS
INTRAVENOUS | Status: AC
  Filled 2017-09-28: qty 20

## 2017-09-28 MED ORDER — PROPOFOL 10 MG/ML IV BOLUS
INTRAVENOUS | Status: DC | PRN
  Administered 2017-09-28: 200 mg via INTRAVENOUS

## 2017-09-28 MED ORDER — CLINDAMYCIN PHOSPHATE 900 MG/50ML IV SOLN
INTRAVENOUS | Status: AC
  Filled 2017-09-28: qty 50

## 2017-09-28 SURGICAL SUPPLY — 64 items
ANCH SUT 1 SHRT SM RGD INSRTR (Anchor) ×2 IMPLANT
ANCHOR SUT 1.45 SZ 1 SHORT (Anchor) ×4 IMPLANT
BANDAGE ACE 3X5.8 VEL STRL LF (GAUZE/BANDAGES/DRESSINGS) ×2 IMPLANT
BANDAGE ACE 4X5 VEL STRL LF (GAUZE/BANDAGES/DRESSINGS) ×2 IMPLANT
BNDG CMPR 9X4 STRL LF SNTH (GAUZE/BANDAGES/DRESSINGS) ×1
BNDG COHESIVE 4X5 TAN STRL (GAUZE/BANDAGES/DRESSINGS) ×3 IMPLANT
BNDG ESMARK 4X9 LF (GAUZE/BANDAGES/DRESSINGS) ×3 IMPLANT
BNDG GAUZE ELAST 4 BULKY (GAUZE/BANDAGES/DRESSINGS) ×2 IMPLANT
CLOSURE WOUND 1/2 X4 (GAUZE/BANDAGES/DRESSINGS) ×1
CORDS BIPOLAR (ELECTRODE) ×3 IMPLANT
COVER MAYO STAND STRL (DRAPES) ×3 IMPLANT
COVER SURGICAL LIGHT HANDLE (MISCELLANEOUS) ×3 IMPLANT
CUFF TOURNIQUET SINGLE 18IN (TOURNIQUET CUFF) ×3 IMPLANT
CUFF TOURNIQUET SINGLE 24IN (TOURNIQUET CUFF) IMPLANT
DRAPE INCISE IOBAN 66X45 STRL (DRAPES) ×3 IMPLANT
DRAPE OEC MINIVIEW 54X84 (DRAPES) IMPLANT
DRAPE ORTHO SPLIT 77X108 STRL (DRAPES) ×6
DRAPE SURG ORHT 6 SPLT 77X108 (DRAPES) ×2 IMPLANT
DRAPE U-SHAPE 47X51 STRL (DRAPES) ×3 IMPLANT
DRSG ADAPTIC 3X8 NADH LF (GAUZE/BANDAGES/DRESSINGS) IMPLANT
DRSG EMULSION OIL 3X3 NADH (GAUZE/BANDAGES/DRESSINGS) ×2 IMPLANT
GAUZE SPONGE 4X4 12PLY STRL (GAUZE/BANDAGES/DRESSINGS) ×2 IMPLANT
GAUZE XEROFORM 5X9 LF (GAUZE/BANDAGES/DRESSINGS) ×3 IMPLANT
GLOVE BIO SURGEON STRL SZ 6.5 (GLOVE) ×1 IMPLANT
GLOVE BIO SURGEONS STRL SZ 6.5 (GLOVE) ×1
GLOVE BIOGEL PI IND STRL 6.5 (GLOVE) IMPLANT
GLOVE BIOGEL PI IND STRL 8.5 (GLOVE) ×1 IMPLANT
GLOVE BIOGEL PI INDICATOR 6.5 (GLOVE) ×2
GLOVE BIOGEL PI INDICATOR 8.5 (GLOVE) ×2
GLOVE SURG ORTHO 8.0 STRL STRW (GLOVE) ×3 IMPLANT
GLOVE SURG SS PI 7.0 STRL IVOR (GLOVE) ×2 IMPLANT
GOWN STRL REUS W/ TWL LRG LVL3 (GOWN DISPOSABLE) ×2 IMPLANT
GOWN STRL REUS W/ TWL XL LVL3 (GOWN DISPOSABLE) ×1 IMPLANT
GOWN STRL REUS W/TWL LRG LVL3 (GOWN DISPOSABLE) ×6
GOWN STRL REUS W/TWL XL LVL3 (GOWN DISPOSABLE) ×3
KIT BASIN OR (CUSTOM PROCEDURE TRAY) ×3 IMPLANT
KIT ROOM TURNOVER OR (KITS) ×3 IMPLANT
LOOP VESSEL MAXI BLUE (MISCELLANEOUS) IMPLANT
MANIFOLD NEPTUNE II (INSTRUMENTS) ×3 IMPLANT
NDL HYPO 25GX1X1/2 BEV (NEEDLE) IMPLANT
NEEDLE HYPO 25GX1X1/2 BEV (NEEDLE) ×3 IMPLANT
NS IRRIG 1000ML POUR BTL (IV SOLUTION) ×3 IMPLANT
PACK ORTHO EXTREMITY (CUSTOM PROCEDURE TRAY) ×3 IMPLANT
PAD ARMBOARD 7.5X6 YLW CONV (MISCELLANEOUS) ×6 IMPLANT
PAD CAST 4YDX4 CTTN HI CHSV (CAST SUPPLIES) IMPLANT
PADDING CAST COTTON 4X4 STRL (CAST SUPPLIES) ×6
SOAP 2 % CHG 4 OZ (WOUND CARE) ×3 IMPLANT
SPLINT FIBERGLASS 3X35 (CAST SUPPLIES) ×2 IMPLANT
STRIP CLOSURE SKIN 1/2X4 (GAUZE/BANDAGES/DRESSINGS) ×1 IMPLANT
SUCTION FRAZIER HANDLE 10FR (MISCELLANEOUS) ×2
SUCTION TUBE FRAZIER 10FR DISP (MISCELLANEOUS) IMPLANT
SUT MNCRL AB 4-0 PS2 18 (SUTURE) ×2 IMPLANT
SUT PROLENE 3 0 PS 2 (SUTURE) IMPLANT
SUT PROLENE 4 0 PS 2 18 (SUTURE) IMPLANT
SUT VIC AB 2-0 CT1 27 (SUTURE) ×3
SUT VIC AB 2-0 CT1 TAPERPNT 27 (SUTURE) IMPLANT
SUT VICRYL 4-0 PS2 18IN ABS (SUTURE) IMPLANT
SYR CONTROL 10ML LL (SYRINGE) IMPLANT
TOWEL OR 17X24 6PK STRL BLUE (TOWEL DISPOSABLE) ×1 IMPLANT
TOWEL OR 17X26 10 PK STRL BLUE (TOWEL DISPOSABLE) ×4 IMPLANT
TUBE CONNECTING 12'X1/4 (SUCTIONS)
TUBE CONNECTING 12X1/4 (SUCTIONS) IMPLANT
UNDERPAD 30X30 (UNDERPADS AND DIAPERS) ×3 IMPLANT
WATER STERILE IRR 1000ML POUR (IV SOLUTION) ×3 IMPLANT

## 2017-09-28 NOTE — Transfer of Care (Signed)
Immediate Anesthesia Transfer of Care Note  Patient: Lindsey Singleton  Procedure(s) Performed: RIGHT ELBOW LATERAL EPICONDYLAR DEBRIDEMENT AND REPAIR AS INDICATED (Right Elbow)  Patient Location: PACU  Anesthesia Type:General  Level of Consciousness: drowsy and patient cooperative  Airway & Oxygen Therapy: Patient Spontanous Breathing and Patient connected to nasal cannula oxygen  Post-op Assessment: Report given to RN, Post -op Vital signs reviewed and stable and Patient moving all extremities  Post vital signs: Reviewed and stable  Last Vitals:  Vitals Value Taken Time  BP    Temp    Pulse 85 09/28/2017  6:33 PM  Resp 13 09/28/2017  6:33 PM  SpO2 94 % 09/28/2017  6:33 PM  Vitals shown include unvalidated device data.  Last Pain:  Vitals:   09/28/17 1435  TempSrc:   PainSc: 1          Complications: No apparent anesthesia complications

## 2017-09-28 NOTE — Op Note (Signed)
PREOPERATIVE DIAGNOSIS:right elbow chronic lateral epicondylitis  POSTOPERATIVE DIAGNOSIS:same  ATTENDING SURGEON:Dr. Gilman SchmidtFred Ortman who was scrubbed and present for the entire procedure  ASSISTANT SURGEON:Samantha Willaim RayasBarton PA-C who was scrubbed and necessary for the entire procedure who helped aid and exposure placement of the suture anchors closure and splinting in a timely fashion  ANESTHESIA: Gen. Via LMA  OPERATIVE PROCEDURE: #1: Right elbow lateral epicondylar debridement and tendon repair #2: Right elbow arthrotomy joint exploration and synovectomy  IMPLANTS:2 juggernaut anchors  RADIOGRAPHIC INTERPRETATION:none  SURGICAL INDICATIONS:right-hand-dominant female with long-standing lateral epicondylar pain Patient had failed nonsurgical treatment. Patient elected undergo the above procedure. Risks of surgery include but not limited to bleeding infection damage to nearby nerves arteries or tendons loss of motion of the wrists and digits incomplete relief of symptoms and need for further surgical intervention.  SURGICAL TECHNIQUE:patient probably identified in the preoperative holding area and a mark with a permanent marker made on the right elbow to indicate correct operative site. Patient then brought back to operating room placed supine on anesthesia and table where general anesthesia was administered. Patient tolerated this well. A well-padded tourniquet was then placed on the right brachium and sealed with the appropriate drape. The right upper extremities and prepped and draped in normal sterile fashion. The limb was then elevated tourniquet insufflated. Timeout was called and the procedure then begun. Several centimeter incision made directly over the lateral aspect of the epicondyle. Dissection carried down through the skin and subcutaneous tissue. The incision was anteriorly. The extensor aponeurosis was then incised longitudinally. This exposed the ECRL origin.this is able to be  carefully elevated exposing the ECRB. With exposure the ECRB patient did have the degenerative tissue within ECRB. ECRB origin was then elevated off the lateral condyle. Joint arthrotomy was then carried out. Arthrotomy and revealed a moderate effusion. Synovectomy was then carried out along the lateral aspect of the joint. The patient did have a mild synovitis. After synovectomy joint arthrotomy thorough irrigation was then done of the joint. Following this 2 anchors were then placed anteriorly along the lateral epicondyle. ECRB origin was then repaired back down to the bone with the suture anchor fixation. The wound was irrigated. The ECRL was then repaired with 2-0 Vicryl suture. The extensor aponeurosis was then repaired with 2-0 Vicryl. The subcutaneous tissues closed with 4-0 Monocryl and the skin closed a running 4-0 Prolene. 10 mL of quarter percent Marcaine infiltrated in the joint. Adaptic dressing a sterile compressive bandage then applied. The patient is then placed in well-padded posterior splint and extubated taken recovery room in good condition.  POSTOPERATIVE PLAN:patient be discharged to home. Seen back in the office in approximately 2 weeks for wound check suture removal keep the splint material we'll put her back into the splint until we get her into therapy for a long-arm splint and evaluation right a therapy order at the per first postoperative visit.no x-rays of the first visit.

## 2017-09-28 NOTE — Discharge Instructions (Signed)
KEEP [facility] CALL OFFICE FOR F/U APPT [phone] IN 14 DAYS RX SENT TO [facility] KEEP HAND ELEVATED ABOVE HEART OK TO APPLY ICE TO OPERATIVE AREA CONTACT OFFICE IF ANY WORSENING PAIN OR CONCERNS.

## 2017-09-28 NOTE — Progress Notes (Signed)
Orthopedic Tech Progress Note Patient Details:  Dustin FlockLuz Casares July 11, 1970 [id number]  Ortho Devices Type of Ortho Device: Arm sling Ortho Device/Splint Location: Right Ortho Device/Splint Interventions: Application, Adjustment   Post Interventions Patient Tolerated: Well Instructions Provided: Care of device, Adjustment of device   Alvina ChouWilliams, Vibha Ferdig C 09/28/2017, 7:27 PM

## 2017-09-28 NOTE — Anesthesia Preprocedure Evaluation (Signed)
Anesthesia Evaluation  Patient identified by MRN, date of birth, ID band Patient awake    Reviewed: Allergy & Precautions, NPO status , Patient's Chart, lab work & pertinent test results  Airway Mallampati: II  TM Distance: >3 FB Neck ROM: Full    Dental  (+) Teeth Intact, Dental Advisory Given   Pulmonary    breath sounds clear to auscultation       Cardiovascular  Rhythm:Regular Rate:Normal     Neuro/Psych    GI/Hepatic   Endo/Other    Renal/GU      Musculoskeletal   Abdominal   Peds  Hematology   Anesthesia Other Findings   Reproductive/Obstetrics                             Anesthesia Physical Anesthesia Plan  ASA: III  Anesthesia Plan: General   Post-op Pain Management:    Induction: Intravenous  PONV Risk Score and Plan: 1 and Ondansetron and Dexamethasone  Airway Management Planned: LMA  Additional Equipment:   Intra-op Plan:   Post-operative Plan:   Informed Consent: I have reviewed the patients History and Physical, chart, labs and discussed the procedure including the risks, benefits and alternatives for the proposed anesthesia with the patient or authorized representative who has indicated his/her understanding and acceptance.   Dental advisory given  Plan Discussed with: CRNA and Anesthesiologist  Anesthesia Plan Comments:         Anesthesia Quick Evaluation  

## 2017-09-28 NOTE — Anesthesia Postprocedure Evaluation (Signed)
Anesthesia Post Note  Patient: Lindsey Singleton  Procedure(s) Performed: RIGHT ELBOW LATERAL EPICONDYLAR DEBRIDEMENT AND REPAIR AS INDICATED (Right Elbow)     Patient location during evaluation: PACU Anesthesia Type: General Level of consciousness: awake and alert Pain management: pain level controlled Vital Signs Assessment: post-procedure vital signs reviewed and stable Respiratory status: spontaneous breathing, nonlabored ventilation, respiratory function stable and patient connected to nasal cannula oxygen Cardiovascular status: blood pressure returned to baseline and stable Postop Assessment: no apparent nausea or vomiting Anesthetic complications: no    Last Vitals:  Vitals:   09/28/17 1900 09/28/17 1903  BP:  102/63  Pulse: 77 77  Resp: 12 11  Temp:    SpO2: 99% 99%    Last Pain:  Vitals:   09/28/17 1913  TempSrc:   PainSc: 10-Worst pain ever                 Shakirra Buehler COKER

## 2017-09-28 NOTE — Anesthesia Procedure Notes (Signed)
Procedure Name: LMA Insertion Date/Time: 09/28/2017 5:28 PM Performed by: Shireen QuanButler, Nataline Basara R, CRNA Pre-anesthesia Checklist: Patient identified, Emergency Drugs available, Suction available and Patient being monitored Patient Re-evaluated:Patient Re-evaluated prior to induction Oxygen Delivery Method: Circle System Utilized Preoxygenation: Pre-oxygenation with 100% oxygen Induction Type: IV induction Ventilation: Mask ventilation without difficulty LMA: LMA inserted LMA Size: 4.0 Number of attempts: 1 Airway Equipment and Method: Bite block Placement Confirmation: positive ETCO2 Tube secured with: Tape Dental Injury: Teeth and Oropharynx as per pre-operative assessment

## 2017-09-29 ENCOUNTER — Encounter (HOSPITAL_COMMUNITY): Payer: Self-pay | Admitting: Orthopedic Surgery

## 2017-11-20 ENCOUNTER — Telehealth: Payer: Self-pay | Admitting: Family Medicine

## 2017-11-20 NOTE — Telephone Encounter (Signed)
Patient's son called in stating that patient has been experiencing pain in chest when inhaling and drinking liquids since yesterday. States pain is not unbearable but is very uncomfortable. States pain is not worsening or getting any better since onset. No sob, or fever. Advised patient's son to take patient to the ER due to chest pain not resolving since onset. Patient's son verbalized understanding.

## 2017-12-02 ENCOUNTER — Encounter: Payer: Self-pay | Admitting: *Deleted

## 2017-12-02 ENCOUNTER — Ambulatory Visit: Payer: Medicaid Other | Attending: Orthopedic Surgery | Admitting: *Deleted

## 2017-12-02 ENCOUNTER — Other Ambulatory Visit: Payer: Self-pay

## 2017-12-02 DIAGNOSIS — R278 Other lack of coordination: Secondary | ICD-10-CM | POA: Diagnosis present

## 2017-12-02 DIAGNOSIS — R600 Localized edema: Secondary | ICD-10-CM | POA: Diagnosis present

## 2017-12-02 DIAGNOSIS — M6281 Muscle weakness (generalized): Secondary | ICD-10-CM

## 2017-12-02 DIAGNOSIS — M25521 Pain in right elbow: Secondary | ICD-10-CM | POA: Diagnosis not present

## 2017-12-02 NOTE — Patient Instructions (Addendum)
WEARING SCHEDULE:  Wear splint at ALL times except for hygiene care/bathing ot to wash your hands (May remove splint for gentle range of motion/exercises to your wrist and hand and then immediately place back as directed by the therapist). Avoid grip/lift with wrist extension.   PURPOSE:  To prevent movement and for protection until injury can heal  CARE OF SPLINT:  Keep splint away from heat sources including: stove, radiator or furnace, or a car in sunlight. The splint can melt and will no longer fit you properly  Keep away from pets and children  Clean the splint with rubbing alcohol 1-2 times per day.  * During this time, make sure you also clean your hand/arm as instructed by your therapist and/or perform dressing changes as needed. Then dry hand/arm completely before replacing splint. (When cleaning hand/arm, keep it immobilized in same position until splint is replaced)  PRECAUTIONS/POTENTIAL PROBLEMS: *If you notice or experience increased pain, swelling, numbness, or a lingering reddened area from the splint: Contact your therapist immediately by calling [phone]. You must wear the splint for protection, but we will get you scheduled for adjustments as quickly as possible.  (If only straps or hooks need to be replaced and NO adjustments to the splint need to be made, just call the office ahead and let them know you are coming in)  If you have any medical concerns or signs of infection, please call your doctor immediately

## 2017-12-02 NOTE — Therapy (Signed)
[facility] [address] [place], [place], [address] Phone: [phone]   Fax:  [phone]  Occupational Therapy Evaluation  Patient Details  Name: Lindsey Singleton MRN: [id number] Date of Birth: [birth year] Referring Provider: Dr Bradly Bienenstock   Encounter Date: 12/02/2017  OT End of Session - 12/02/17 1330    Visit Number  1    Number of Visits  -- Medicaid pt requesting eval + 3 visits first month followed by up to 5 additional visits over next 4 weeks    Authorization Type  *Need Medicaid Authorization*: requesting eval + 3 visits first month followed by up to 5 additional visits over next 4 weeks    OT Start Time  0800    OT Stop Time  0915    OT Time Calculation (min)  75 min    Activity Tolerance  Patient tolerated treatment well    Behavior During Therapy  [facility] for tasks assessed/performed       Past Medical History:  Diagnosis Date  . Arthritis   . Asthma   . Chronic kidney disease    recurrent pyelo  . Gastric polyps 08/07/2014  . Gastritis    no current medication  . GERD (gastroesophageal reflux disease)   . 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 Hyperlipidemia    no medication    Past Surgical History:  Procedure Laterality Date  . APPENDECTOMY    . carpal tunnel Right 11/2015  . CESAREAN SECTION  1992, 2005   x 2  . ESOPHAGOGASTRODUODENOSCOPY    . OPEN REDUCTION MALAR FRACTURE Left    x 3 surgeries  . TENNIS ELBOW RELEASE/NIRSCHEL PROCEDURE Right 09/28/2017   Procedure: RIGHT ELBOW LATERAL EPICONDYLAR DEBRIDEMENT AND REPAIR AS INDICATED;  Surgeon: Bradly Bienenstock, MD;  Location: [facility] OR;  Service: Orthopedics;  Laterality: Right;    There were no vitals filed for this visit.  Subjective Assessment - 12/02/17 0806    Subjective   Pt underwent Right elbow lateral epicondylar debriedment and tendon repair , joint arthrotomy and synovectomy on 09/28/17 by Dr Melvyn Novas. She presents to out-pt OT w/o  splint or dressing on R UE noted.     Patient is accompained by:  Family member Daughter - Cleotis Nipper    Currently in Pain?  Yes    Pain Score  7     Pain Location  Elbow    Pain Orientation  Right    Pain Descriptors / Indicators  Aching;Sore    Pain Type  Surgical pain    Pain Onset  More than a month ago    Pain Frequency  Intermittent    Aggravating Factors   Movement and functional use per pt report    Pain Relieving Factors  Rest/Non-use    Multiple Pain Sites  No        [facility] OT Assessment - 12/02/17 0001      Assessment   Medical Diagnosis  R elbow lateral epicondylar debridement , tendon repair, joint arthroplasty and synovectomy    Referring Provider  Dr Bradly Bienenstock    Onset Date/Surgical Date  09/28/17    Hand Dominance  Right    Next MD Visit  11/11/17    Prior Therapy  No      Precautions   Precautions  Other (comment) As per surgical repair/none written    Precaution Comments  -- Pt is w/o splint or arm wrapped in any way      Home  Environment  Lives With  Family      Prior Function   Level of Independence  Independent    Vocation  Other (comment) Doesn't work outside of the home      ADL   ADL comments  Pt lives with family and family is helping w/ laundry, cleaning, bathing PRN, cooking etc.      Written Expression   Dominant Hand  Right      Activity Tolerance   Activity Tolerance Comments  Decreased overall functional activity and currently requiring assistance with ADL's/homemaking      Cognition   Overall Cognitive Status  Within Functional Limits for tasks assessed      Observation/Other Assessments   Observations  Well healed scar R lateral epicondyle noted. R arm and hand with edema noted and pt reports of difficulty making fist "hand is tight". Pt is also w/o splint of any kind and MD orders are for long arm splint R UE. Pt is currently 9 weeks and 2 days op. OT to contact MD via email as per his preference, re: clarification of orders as pt  states that she had f/u appointment on 11/11/17 and did not present to clinic with clarified/updated orders therefore long arm splint was fabricated as ordered by MD..      Sensation   Light Touch  Appears Intact    Additional Comments  Some hypersensitivity along R lateral epicondyle/scar noted      Coordination   Coordination  Slightley impaired secondary to edema RUE/hand. Able to make a fist w/ c/o "tightness" or stiffness      Edema   Edema  Yes - R UE/hand. Pt was educated in elevation, cryotherapy and gentle A/ROM when out of splint. It was stressed to pt to cont splint use as MD orders state until clarification orders are received.               OT Treatments/Exercises (OP) - 12/02/17 0001      Splinting   Splinting  Pt presents today per Dr Melvyn Novas for long arm splinting RUE following right elbow lateral epicondyle debridement, tendon repair, joint arthrotomy and synovectomy. She is currently 9 weeks and 2 days post op surgery and reports that she was seen in Dr Evaristo Bury office on 11/11/17. She did not have any protective splint/brace on and did not present to clinic with an updated/clarification of orders. Dr Melvyn Novas will be contacted via email for this as this is his preference. She presented to clinic with a well healed scar R lateral epicondyle and edema throughout RUE as observed visually in clinic. She is hypersensitive along scar and was instructed in gentle massage and edema control techniques. She was then fitted with custom fabricated long arm splint placing her arm in about 75* flexion (note: min -mod edema and girth currently prevented 90* angle), forearm in neutral and wrist slightly extended. She was educated in splinting use, care and precautions via interpreter whom was present throughout session. She was educated to remove splint for bathing and ADL care and should benfeit from clarification of orders for further need to use this splint. She was educated to cont with  A/ROM R shoulder and basic active elbow flexion/extension and gentle wrist flex/extension after bathing and prior to reapplying splint to asist with prevention of joint stiffness RUE as she is already 9 weeks+ post op.             OT Education - 12/02/17 1328    Education provided  Yes  Education Details  Splint use, care and precautions R UE    Person(s) Educated  Patient via interpreter: Marta Col   via interpreter: Marta Col   Methods  Explanation;Demonstration;Verbal cues;Handout    Comprehension  Verbalized understanding       OT Short Term Goals - 12/02/17 1354      OT SHORT TERM GOAL #1   Title  Pt will I splint use, care and precautions R UE    Baseline  req VC's    Time  4    Period  Weeks    Status  New    Target Date  12/30/17      OT SHORT TERM GOAL #2   Title  Pt will be Mod I scar manageement and desensitization R lateral epicondyle    Baseline  Dependent    Time  4    Period  Weeks    Status  New    Target Date  12/30/17      OT SHORT TERM GOAL #3   Title  Pt will be Mod I HEP R UE     Baseline  Dependent/need MD clarificiation    Time  4    Period  Weeks    Status  New    Target Date  12/30/17      OT SHORT TERM GOAL #4   Title  Pt will be Mod I edema control techniques RUE    Baseline  requires VC's    Time  4    Period  Weeks    Status  New    Target Date  12/30/17        OT Long Term Goals - 12/02/17 1357      OT LONG TERM GOAL #1   Title  Pt will be Mod I upgraded HEP RUE    Baseline  Unable    Time  8    Period  Weeks    Status  New    Target Date  01/27/18      OT LONG TERM GOAL #2   Title  Pt will demonstrate functional grip R as seen by JAMAR dynamometer assessment of 20# or more     Baseline  unable    Time  8    Period  Weeks    Status  New      OT LONG TERM GOAL #3   Title  Pt will report pain R elbow as 3/10 or less during basic homemaking and ADL tasks    Baseline  6-7/10 with activity    Time  8    Period   Weeks    Status  New            Plan - 12/02/17 1339    Clinical Impression Statement  Pt is a pleasant 47 y/o female with PMH: osteoarthritis, CTR (2017), MDD, Asthma, GERD, hyperlipidemia, CKD. She is s/p R lateral epicondyle debridement, tendon repair, joint arthrotomy and synovectomy on 09/28/17 by Dr Melvyn Novas. She is referred by him today for a custom long arm splint R. Pt is currently 9 weeks and 2 days post-op surgery and reports that she had an appointment on 11/11/17 but does not present to clinic with clarification/updated orders therefore she was fitted with R longarm splint and educated in splinting use, care and precautions via interpreter whom was present throughout her assessment. Due to her date of surgery, Dr Melvyn Novas will be sent an email with a request for clarification of orders (ie: when  can she d/c splint, when can she begin A/ROM, strengthening etc) as this is preferred by him. She has min-mod edema R UE and did not present to clinic with any type of splint/cast on her forearm and states that she has lateral epicondylitis scar hypersensitivity. She currently needs asssit for ADL's and functional homemaking activities and should benefit from out-pt OT for splinting, pt/family education, HEP/ROM and eventual strengthening as per Dr Melvyn Novas.    Occupational performance deficits (Please refer to evaluation for details):  ADL's;IADL's    OT Frequency  Other (comment) *Need Medicaid Authorization*: requesting eval + 3 visits first month followed by up to 5 additional visits over next 4 weeks    OT Duration  Other (comment) requesting eval + 3 visits first month followed by up to 5 additional visits over next 4 weeks    OT Treatment/Interventions  Self-care/ADL training;Therapeutic exercise;Splinting;Patient/family education;Scar mobilization;Therapeutic activities;Ultrasound;Cryotherapy;Manual Therapy;DME and/or AE instruction    Plan  Check for Medicaid Auth., Splint check and  adjustments PRN vs d/c splint (check for clarification of orders from Dr Charissa Bash sent), scar management/desensitization, HEP if ok'd.    Clinical Decision Making  Several treatment options, min-mod task modification necessary    Consulted and Agree with Plan of Care  Patient Via interpreter Marta Col from [facility]       Patient will benefit from skilled therapeutic intervention in order to improve the following deficits and impairments:  Increased edema, Pain, Decreased coordination, Decreased scar mobility, Decreased activity tolerance, Decreased strength, Impaired UE functional use  Visit Diagnosis: Pain in right elbow - Plan: Ot plan of care cert/re-cert  Muscle weakness (generalized) - Plan: Ot plan of care cert/re-cert  Other lack of coordination - Plan: Ot plan of care cert/re-cert  Localized edema - Plan: Ot plan of care cert/re-cert    Problem List Patient Active Problem List   Diagnosis Date Noted  . MDD (major depressive disorder) 03/25/2016  . Adjustment reaction with anxiety and depression 10/09/2015  . Osteoarthritis of both hands 01/22/2015  . Carpal tunnel syndrome 01/22/2015  . Cerumen impaction 03/29/2014  . Bilateral knee pain 12/26/2013  . OA (osteoarthritis) of foot 12/26/2013  . Breast pain 12/26/2013  . Irregular menses 10/25/2013  . 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 Hyperlipidemia   . Obesity 07/27/2013  . Seborrheic dermatitis of scalp 07/20/2013  . GERD (gastroesophageal reflux disease) 07/20/2013    Charletta Cousin, Amy Dionicio Stall, OTR/L 12/02/2017, 2:07 PM   [facility] [address] [place], [place], [address] Phone: [phone]   Fax:  [phone]  Name: Lindsey Singleton MRN: [id number] Date of Birth: 02/05/1971

## 2017-12-18 ENCOUNTER — Ambulatory Visit: Payer: Medicaid Other | Attending: Orthopedic Surgery | Admitting: Occupational Therapy

## 2017-12-18 DIAGNOSIS — M25521 Pain in right elbow: Secondary | ICD-10-CM

## 2017-12-18 DIAGNOSIS — M6281 Muscle weakness (generalized): Secondary | ICD-10-CM | POA: Insufficient documentation

## 2017-12-18 DIAGNOSIS — R278 Other lack of coordination: Secondary | ICD-10-CM

## 2017-12-18 NOTE — Patient Instructions (Addendum)
1. Grip Strengthening (Resistive Putty)   Squeeze putty using thumb and all fingers. Repeat _20___ times. Do __2__ sessions per day.   2. Roll putty into tube on table and pinch between each finger and thumb x 10 reps each. (can do ring and small finger together)     Copyright  VHI. All rights reserved.  AROM: Wrist Extension   .  With _right___ palm down, bend wrist up. Repeat __15__ times per set.  Do __4-6__ sessions per day.    AROM: Wrist Flexion   With__right___ palm up, bend wrist up. Repeat __15__ times per set.  Do _4-6___ sessions per day.   AROM: Forearm Pronation / Supination   With _right___ arm in handshake position, slowly rotate palm down until stretch is felt. Relax. Then rotate palm up until stretch is felt. Repeat _15___ times per set. Do _4-6___ sessions per day.  Copyright  VHI. All rights reserved.          Copyright  VHI. All rights reserved.  AROM: Elbow Flexion / Extension   With left hand palm up, gently bend elbow as far as possible. Then straighten arm as far as possible. Repeat 10__ times per set. Do __1-2__ sets per session. Do __2__ sessions per day. Perfrom 10-20 reps without weight, then hold a 1 lb water bottle and bend elbow.

## 2017-12-18 NOTE — Therapy (Addendum)
[facility] [address] [place], [place], [address] Phone: [phone]   Fax:  [phone]  Occupational Therapy Treatment  Patient Details  Name: Lindsey FlockLuz Rihn MRN: [id number] Date of Birth: 1970/07/13 Referring Provider: Dr Bradly BienenstockFred Ortmann   Encounter Date: 12/18/2017  OT End of Session - 12/18/17 0851    Visit Number  2    Number of Visits  4    Authorization Type  Medicaid 3 visits 6/14-01/07/18    Authorization - Visit Number  1    Authorization - Number of Visits  3    OT Start Time  0848    OT Stop Time  0930    OT Time Calculation (min)  42 min       Past Medical History:  Diagnosis Date  . Arthritis   . Asthma   . Chronic kidney disease    recurrent pyelo  . Gastric polyps 08/07/2014  . Gastritis    no current medication  . GERD (gastroesophageal reflux disease)   . 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 Hyperlipidemia    no medication    Past Surgical History:  Procedure Laterality Date  . APPENDECTOMY    . carpal tunnel Right 11/2015  . CESAREAN SECTION  1992, 2005   x 2  . ESOPHAGOGASTRODUODENOSCOPY    . OPEN REDUCTION MALAR FRACTURE Left    x 3 surgeries  . TENNIS ELBOW RELEASE/NIRSCHEL PROCEDURE Right 09/28/2017   Procedure: RIGHT ELBOW LATERAL EPICONDYLAR DEBRIDEMENT AND REPAIR AS INDICATED;  Surgeon: Bradly Bienenstockrtmann, Fred, MD;  [facility];  Service: Orthopedics;  Laterality: Right;    There were no vitals filed for this visit.  Subjective Assessment - 12/18/17 0848    Subjective   Pain in hand and elbow    Currently in Pain?  Yes    Pain Score  7     Pain Location  Elbow    Pain Orientation  Right    Pain Descriptors / Indicators  Aching    Pain Type  Surgical pain    Pain Onset  More than a month ago    Pain Frequency  Intermittent    Aggravating Factors   overuse    Pain Relieving Factors  rest          [facility] OT Assessment - 12/18/17 0001      Precautions   Precaution  Comments  cleared for A/ROM and strenghtening by DR. Ortmann, d/c splint             Treatement: US 3mz, 0.8 w/cm2, 20% x 8 mins to right elbow for scar mobilization, no adverse reactions. Pt was instructed in HEP, via interpreter.          OT Education - 12/18/17 1642    Education provided  Yes    Education Details  inital HEP for yellow putty, elbow strengthening, A/ROM- see pt instructions, discontinue splint, scar massage    Person(s) Educated  Patient;Other (comment) interpreter   interpreter   Methods  Explanation;Demonstration;Verbal cues;Handout    Comprehension  Verbalized understanding;Returned demonstration;Verbal cues required       OT Short Term Goals - 12/02/17 1354      OT SHORT TERM GOAL #1   Title  Pt will I splint use, care and precautions R UE    Baseline  req VC's    Time  4    Period  Weeks    Status  New    Target Date  12/30/17  OT SHORT TERM GOAL #2   Title  Pt will be Mod I scar manageement and desensitization R lateral epicondyle    Baseline  Dependent    Time  4    Period  Weeks    Status  New    Target Date  12/30/17      OT SHORT TERM GOAL #3   Title  Pt will be Mod I HEP R UE     Baseline  Dependent/need MD clarificiation    Time  4    Period  Weeks    Status  New    Target Date  12/30/17      OT SHORT TERM GOAL #4   Title  Pt will be Mod I edema control techniques RUE    Baseline  requires VC's    Time  4    Period  Weeks    Status  New    Target Date  12/30/17        OT Long Term Goals - 12/02/17 1357      OT LONG TERM GOAL #1   Title  Pt will be Mod I upgraded HEP RUE    Baseline  Unable    Time  8    Period  Weeks    Status  New    Target Date  01/27/18      OT LONG TERM GOAL #2   Title  Pt will demonstrate functional grip R as seen by JAMAR dynamometer assessment of 20# or more     Baseline  unable    Time  8    Period  Weeks    Status  New      OT LONG TERM GOAL #3   Title  Pt will report pain  R elbow as 3/10 or less during basic homemaking and ADL tasks    Baseline  6-7/10 with activity    Time  8    Period  Weeks    Status  New            Plan - 12/18/17 1643    Clinical Impression Statement  Pt was educated in inital HEP, she returned demonstration.    Occupational performance deficits (Please refer to evaluation for details):  ADL's;IADL's    OT Frequency  --  3 visits    OT Duration  4 weeks    OT Treatment/Interventions  Self-care/ADL training;Therapeutic exercise;Splinting;Patient/family education;Scar mobilization;Therapeutic activities;Ultrasound;Cryotherapy;Manual Therapy;DME and/or AE instruction    Plan  Check for Medicaid Auth., Splint check and adjustments PRN vs d/c splint (check for clarification of orders from Dr Charissa Bash sent), scar management/desensitization, HEP if ok'd.    Clinical Decision Making  Several treatment options, min-mod task modification necessary    Consulted and Agree with Plan of Care  Patient       Patient will benefit from skilled therapeutic intervention in order to improve the following deficits and impairments:  Increased edema, Pain, Decreased coordination, Decreased scar mobility, Decreased activity tolerance, Decreased strength, Impaired UE functional use  Visit Diagnosis: Pain in right elbow  Muscle weakness (generalized)  Other lack of coordination    Problem List Patient Active Problem List   Diagnosis Date Noted  . MDD (major depressive disorder) 03/25/2016  . Adjustment reaction with anxiety and depression 10/09/2015  . Osteoarthritis of both hands 01/22/2015  . Carpal tunnel syndrome 01/22/2015  . Cerumen impaction 03/29/2014  . Bilateral knee pain 12/26/2013  . OA (osteoarthritis) of foot 12/26/2013  . Breast pain 12/26/2013  .  Irregular menses 10/25/2013  . 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 Hyperlipidemia   . Obesity 07/27/2013  . Seborrheic dermatitis of scalp 07/20/2013  . GERD  (gastroesophageal reflux disease) 07/20/2013    RINE,KATHRYN 12/18/2017, 4:53 PM Keene Breath, OTR/L [phone] Phone: [phone] 4:53 PM 12/18/17 [facility] [address] [place], [place], [address] Phone: [phone]   Fax:  [phone]  Name: Taneasha Fuqua MRN: [id number] Date of Birth: May 26, 1971

## 2017-12-30 ENCOUNTER — Ambulatory Visit: Payer: Medicaid Other | Admitting: Occupational Therapy

## 2017-12-30 DIAGNOSIS — M6281 Muscle weakness (generalized): Secondary | ICD-10-CM

## 2017-12-30 DIAGNOSIS — M25521 Pain in right elbow: Secondary | ICD-10-CM

## 2017-12-30 DIAGNOSIS — R278 Other lack of coordination: Secondary | ICD-10-CM

## 2017-12-30 NOTE — Therapy (Signed)
[facility] [address] [place], [place], [address] Phone: [phone]   Fax:  [phone]  Occupational Therapy Treatment  Patient Details  Name: Lindsey Singleton MRN: [id number] Date of Birth: 1970-09-24 Referring Provider: Dr Bradly BienenstockFred Ortmann   Encounter Date: 12/30/2017  OT End of Session - 12/30/17 0908    Visit Number  3    Number of Visits  4    Authorization Type  Medicaid 3 visits 6/14-01/07/18    Authorization - Visit Number  2    Authorization - Number of Visits  3    OT Start Time  0847    OT Stop Time  0930    OT Time Calculation (min)  43 min    Activity Tolerance  Patient tolerated treatment well    Behavior During Therapy  [facility] for tasks assessed/performed       Past Medical History:  Diagnosis Date  . Arthritis   . Asthma   . Chronic kidney disease    recurrent pyelo  . Gastric polyps 08/07/2014  . Gastritis    no current medication  . GERD (gastroesophageal reflux disease)   . 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 Hyperlipidemia    no medication    Past Surgical History:  Procedure Laterality Date  . APPENDECTOMY    . carpal tunnel Right 11/2015  . CESAREAN SECTION  1992, 2005   x 2  . ESOPHAGOGASTRODUODENOSCOPY    . OPEN REDUCTION MALAR FRACTURE Left    x 3 surgeries  . TENNIS ELBOW RELEASE/NIRSCHEL PROCEDURE Right 09/28/2017   Procedure: RIGHT ELBOW LATERAL EPICONDYLAR DEBRIDEMENT AND REPAIR AS INDICATED;  Surgeon: Bradly Bienenstockrtmann, Fred, MD;  [facility];  Service: Orthopedics;  Laterality: Right;    There were no vitals filed for this visit.  Subjective Assessment - 12/30/17 0905    Subjective   Pt reports her arm is getting stronger    Currently in Pain?  Yes    Pain Score  5     Pain Location  Elbow    Pain Orientation  Right    Pain Descriptors / Indicators  Aching    Pain Type  Surgical pain    Pain Onset  More than a month ago    Pain Frequency  Intermittent    Aggravating  Factors   overuse    Pain Relieving Factors  rest               Treatment: US 3mhz, 0.8 w/cm2 , 20% x 8 mins to right elbow and forearm for scar mobilization, no adverse reactions. Reviewed initial HEP, and progressed to elbow flexion/ extension with 1 lbs weight, supination/ pronation with light hammer, and wrist flexion/ extension with 1 lbs weight, min v.c for positioning. A/ROM bilateral shoulder flexion, chest press and abduction performed, min v.c due to pt tightness in R shoulder. Pt used yellow putty for increased sustained grip and pinch., min v.c             OT Short Term Goals - 12/30/17 0911      OT SHORT TERM GOAL #1   Title  Pt will I splint use, care and precautions R UE    Baseline  req VC's    Time  4    Period  Weeks    Status  Achieved      OT SHORT TERM GOAL #2   Title  Pt will be Mod I scar manageement and desensitization R lateral epicondyle  Baseline  Dependent    Time  4    Period  Weeks    Status  Achieved      OT SHORT TERM GOAL #3   Title  Pt will be Mod I HEP R UE     Baseline  Dependent/need MD clarificiation    Time  4    Period  Weeks    Status  Achieved      OT SHORT TERM GOAL #4   Title  Pt will be Mod I edema control techniques RUE    Baseline  requires VC's    Time  4    Period  Weeks    Status  Achieved Pt reports using ice at home, she was educated to elevate        OT Long Term Goals - 12/30/17 0919      OT LONG TERM GOAL #1   Title  Pt will be Mod I upgraded HEP RUE    Baseline  Unable    Time  8    Period  Weeks    Status  On-going      OT LONG TERM GOAL #2   Title  Pt will demonstrate functional grip R as seen by JAMAR dynamometer assessment of 20# or more     Baseline  unable    Time  8    Period  Weeks    Status  On-going      OT LONG TERM GOAL #3   Title  Pt will report pain R elbow as 3/10 or less during basic homemaking and ADL tasks    Baseline  6-7/10 with activity    Time  8    Period   Weeks    Status  On-going            Plan - 12/30/17 1610    Clinical Impression Statement  Pt is progressing towards goals. She demonstrates improving strength and decreased pain today.    Occupational performance deficits (Please refer to evaluation for details):  ADL's;IADL's    Rehab Potential  Good    OT Frequency  -- 3 visits    OT Duration  4 weeks    OT Treatment/Interventions  Self-care/ADL training;Therapeutic exercise;Splinting;Patient/family education;Scar mobilization;Therapeutic activities;Ultrasound;Cryotherapy;Manual Therapy;DME and/or AE instruction    Plan  continue to addres RUE strengthening, check goals possible d/c     Consulted and Agree with Plan of Care  Patient       Patient will benefit from skilled therapeutic intervention in order to improve the following deficits and impairments:  Increased edema, Pain, Decreased coordination, Decreased scar mobility, Decreased activity tolerance, Decreased strength, Impaired UE functional use  Visit Diagnosis: Pain in right elbow  Muscle weakness (generalized)  Other lack of coordination    Problem List Patient Active Problem List   Diagnosis Date Noted  . MDD (major depressive disorder) 03/25/2016  . Adjustment reaction with anxiety and depression 10/09/2015  . Osteoarthritis of both hands 01/22/2015  . Carpal tunnel syndrome 01/22/2015  . Cerumen impaction 03/29/2014  . Bilateral knee pain 12/26/2013  . OA (osteoarthritis) of foot 12/26/2013  . Breast pain 12/26/2013  . Irregular menses 10/25/2013  . 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 Hyperlipidemia   . Obesity 07/27/2013  . Seborrheic dermatitis of scalp 07/20/2013  . GERD (gastroesophageal reflux disease) 07/20/2013    Singleton,Lindsey 12/30/2017, 9:32 AM  [facility] [address] [place], [place], [address] Phone: [phone]  Fax:  [phone]  Name: Lindsey Singleton MRN:  [id number] Date of Birth: January 17, 1971

## 2018-01-05 ENCOUNTER — Ambulatory Visit: Payer: Medicaid Other | Attending: Orthopedic Surgery | Admitting: Occupational Therapy

## 2018-01-05 DIAGNOSIS — M25521 Pain in right elbow: Secondary | ICD-10-CM | POA: Insufficient documentation

## 2018-01-05 DIAGNOSIS — R278 Other lack of coordination: Secondary | ICD-10-CM | POA: Diagnosis present

## 2018-01-05 DIAGNOSIS — M6281 Muscle weakness (generalized): Secondary | ICD-10-CM | POA: Diagnosis present

## 2018-01-05 NOTE — Therapy (Signed)
[facility] [address] [place], [place], [address] Phone: [phone]   Fax:  [phone]  Occupational Therapy Treatment  Patient Details  Name: Lindsey Singleton MRN: [id number] Date of Birth: Jan 30, 1971 Referring Provider: Dr Iran Planas   Encounter Date: 01/05/2018  OT End of Session - 01/05/18 0821    Visit Number  4    Number of Visits  4    Authorization Type  Medicaid 3 visits 6/14-01/07/18    Authorization - Visit Number  3    Authorization - Number of Visits  3    OT Start Time  0803    OT Stop Time  0845    OT Time Calculation (min)  42 min    Activity Tolerance  Patient tolerated treatment well    Behavior During Therapy  [facility] for tasks assessed/performed       Past Medical History:  Diagnosis Date  . Arthritis   . Asthma   . Chronic kidney disease    recurrent pyelo  . Gastric polyps 08/07/2014  . Gastritis    no current medication  . GERD (gastroesophageal reflux disease)   . 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 Hyperlipidemia    no medication    Past Surgical History:  Procedure Laterality Date  . APPENDECTOMY    . carpal tunnel Right 11/2015  . [place], 2005   x 2  . ESOPHAGOGASTRODUODENOSCOPY    . OPEN REDUCTION MALAR FRACTURE Left    x 3 surgeries  . TENNIS ELBOW RELEASE/NIRSCHEL PROCEDURE Right 09/28/2017   Procedure: RIGHT ELBOW LATERAL EPICONDYLAR DEBRIDEMENT AND REPAIR AS INDICATED;  Surgeon: Iran Planas, MD;  Location: [place];  Service: Orthopedics;  Laterality: Right;    There were no vitals filed for this visit.  Subjective Assessment - 01/05/18 0820    Subjective   Pt reports her arm is better    Currently in Pain?  Yes    Pain Score  5     Pain Location  Elbow    Pain Orientation  Right    Pain Descriptors / Indicators  Aching    Pain Type  Surgical pain    Pain Onset  More than a month ago    Pain Frequency  Intermittent    Aggravating Factors    overuse    Pain Relieving Factors  rest    Multiple Pain Sites  No            Treatment:US 66mz, 0.8 w/cm2 , 20% x 8 mins to right elbow and forearm for scar mobilization, no adverse reactions. Reviewed A/ROM HEP, and progressed to elbow flexion/ extension with 1 lbs weight, supination/ pronation with light hammer,  min v.c for positioning. A/ROM shoulder flexion, then abduction 10-15 reps of each exercise A/ROM : elbow flexion 135, extension -5, supination/ pronation: 85/80 Pt used red putty for increased sustained grip and pinch., min v.c (HEP was upgraded and pt demonstrates understanding  Therapist checked progress towards goals. Pt agrees with plans for d/c, she reports her arm is back to how it was before the surgery and she is performing all her basic ADLS and home management at a modified independent level                  OT Short Term Goals - 01/05/18 1051      OT SHORT TERM GOAL #1   Title  Pt will I splint use, care and precautions R UE  Baseline  req VC's    Time  4    Period  Weeks    Status  Achieved      OT SHORT TERM GOAL #2   Title  Pt will be Mod I scar manageement and desensitization R lateral epicondyle    Baseline  Dependent    Time  4    Period  Weeks    Status  Achieved Pt verbalizes understanding      OT SHORT TERM GOAL #3   Title  Pt will be Mod I HEP R UE     Baseline  Dependent/need MD clarificiation    Time  4    Period  Weeks    Status  Achieved      OT SHORT TERM GOAL #4   Title  Pt will be Mod I edema control techniques RUE    Baseline  requires VC's    Time  4    Period  Weeks    Status  Achieved Pt reports using ice at home, she was educated to elevate        OT Long Term Goals - 01/05/18 1660      OT LONG TERM GOAL #1   Title  Pt will be Mod I upgraded HEP RUE    Status  Achieved      OT LONG TERM GOAL #2   Title  Pt will demonstrate functional grip R as seen by JAMAR dynamometer assessment of 20# or more      Baseline  unable    Status  Achieved 23#      OT LONG TERM GOAL #3   Title  Pt will report pain R elbow as 3/10 or less during basic homemaking and ADL tasks    Status  Not Met not met, pt continues to bear weight through crutches and this seems to be a factor for causing pain            Plan - 01/05/18 1053    Clinical Impression Statement  Pt agrees with plans to d/c from therapy today. She reports that she feels as though her arm is back to baseline.    Rehab Potential  Good    OT Frequency  -- 3 visits    OT Duration  4 weeks    OT Treatment/Interventions  Self-care/ADL training;Therapeutic exercise;Splinting;Patient/family education;Scar mobilization;Therapeutic activities;Ultrasound;Cryotherapy;Manual Therapy;DME and/or AE instruction    Plan  d/c OT    Consulted and Agree with Plan of Care  Patient interpreter       Patient will benefit from skilled therapeutic intervention in order to improve the following deficits and impairments:  Increased edema, Pain, Decreased coordination, Decreased scar mobility, Decreased activity tolerance, Decreased strength, Impaired UE functional use  Visit Diagnosis: Pain in right elbow  Muscle weakness (generalized)  Other lack of coordination OCCUPATIONAL THERAPY DISCHARGE SUMMARY   Current functional level related to goals / functional outcomes: Pt made good overall progress however she did not fully meet her pain goal. She continues to weightbear through bilateral crutches which seems to provoke pain. Therapist recommended pt contact her MD regarding a referral to PT r that she uses her walker to minimize UE pain.   Remaining deficits: Decreased strength, pain   Education / Equipment:  Pt as educated regarding: splint wear and care, and HEP. Pt verbalized understanding of all education. She can benefit from a referral to PT for mobility and LE pain.  Plan: Patient agrees to discharge.  Patient goals were  partially met. Patient  is being discharged due to being pleased with the current functional level.  ?????       Problem List Patient Active Problem List   Diagnosis Date Noted  . MDD (major depressive disorder) 03/25/2016  . Adjustment reaction with anxiety and depression 10/09/2015  . Osteoarthritis of both hands 01/22/2015  . Carpal tunnel syndrome 01/22/2015  . Cerumen impaction 03/29/2014  . Bilateral knee pain 12/26/2013  . OA (osteoarthritis) of foot 12/26/2013  . Breast pain 12/26/2013  . Irregular menses 10/25/2013  . 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 Hyperlipidemia   . Obesity 07/27/2013  . Seborrheic dermatitis of scalp 07/20/2013  . GERD (gastroesophageal reflux disease) 07/20/2013    Perrie Ragin 01/05/2018, 10:56 AM Theone Murdoch, OTR/L [phone] Phone: [phone] 10:56 AM 01/05/18 [facility] [address] [place], [place], [address] Phone: [phone]   Fax:  [phone]  Name: Lindsey Singleton MRN: [id number] Date of Birth: [birth year]

## 2018-05-20 ENCOUNTER — Ambulatory Visit: Payer: Medicaid Other | Admitting: Family Medicine

## 2018-05-26 ENCOUNTER — Encounter: Payer: Self-pay | Admitting: Family Medicine

## 2018-05-31 NOTE — Congregational Nurse Program (Unsigned)
Visit to nurse office at [employer] [facility] [employer] [employer] during English classes for this Spanish speaking woman able to communicate in basic English for interview. Family available to interpret in [place].  Normally ambulates with use of an crutches due to right leg pain. Main concern today is chest pains, feeling hot and cold, sleep problems, feeling tired and sleepy, lower abdominal pain, and multiple joint pains.  No recent physical exam nor preventive gynecological exam. No recent Pap smear or breast exam. Last menses 3-4 years ago. Requesting assistance scheduling [facility]. Feels unable to navigate use of phone due to limited English and understanding. [facility], [place] Return to NAI office prn for assistance. Ferol LuzMarietta Oliviah Agostini, R.N./CN

## 2019-07-27 ENCOUNTER — Ambulatory Visit: Payer: Self-pay | Attending: Internal Medicine

## 2019-07-27 ENCOUNTER — Other Ambulatory Visit: Payer: Self-pay

## 2019-07-27 DIAGNOSIS — Z20822 Contact with and (suspected) exposure to covid-19: Secondary | ICD-10-CM

## 2019-07-28 LAB — NOVEL CORONAVIRUS, NAA: SARS-CoV-2, NAA: NOT DETECTED

## 2019-08-03 ENCOUNTER — Encounter: Payer: Self-pay | Admitting: Family Medicine

## 2020-10-24 ENCOUNTER — Other Ambulatory Visit: Payer: Self-pay | Admitting: Family Medicine

## 2020-10-24 DIAGNOSIS — M25511 Pain in right shoulder: Secondary | ICD-10-CM

## 2021-08-16 ENCOUNTER — Other Ambulatory Visit: Payer: Self-pay

## 2021-08-16 ENCOUNTER — Emergency Department (HOSPITAL_COMMUNITY): Payer: Medicaid Other

## 2021-08-16 ENCOUNTER — Emergency Department (HOSPITAL_COMMUNITY)
Admission: EM | Admit: 2021-08-16 | Discharge: 2021-08-17 | Disposition: A | Discharge status: Home / Self Care | Payer: Medicaid Other | Attending: Emergency Medicine | Admitting: Emergency Medicine

## 2021-08-16 DIAGNOSIS — Z20822 Contact with and (suspected) exposure to covid-19: Secondary | ICD-10-CM | POA: Insufficient documentation

## 2021-08-16 DIAGNOSIS — R0602 Shortness of breath: Secondary | ICD-10-CM

## 2021-08-16 DIAGNOSIS — J4541 Moderate persistent asthma with (acute) exacerbation: Secondary | ICD-10-CM | POA: Diagnosis not present

## 2021-08-16 DIAGNOSIS — R0682 Tachypnea, not elsewhere classified: Secondary | ICD-10-CM | POA: Diagnosis not present

## 2021-08-16 DIAGNOSIS — Z7951 Long term (current) use of inhaled steroids: Secondary | ICD-10-CM | POA: Insufficient documentation

## 2021-08-16 DIAGNOSIS — R519 Headache, unspecified: Secondary | ICD-10-CM | POA: Insufficient documentation

## 2021-08-16 LAB — CBC WITH DIFFERENTIAL/PLATELET
Abs Immature Granulocytes: 0.03 10*3/uL (ref 0.00–0.07)
Basophils Absolute: 0.1 10*3/uL (ref 0.0–0.1)
Basophils Relative: 1 %
Eosinophils Absolute: 0.5 10*3/uL (ref 0.0–0.5)
Eosinophils Relative: 5 %
HCT: 39.4 % (ref 36.0–46.0)
Hemoglobin: 12.6 g/dL (ref 12.0–15.0)
Immature Granulocytes: 0 %
Lymphocytes Relative: 49 %
Lymphs Abs: 4.9 10*3/uL — ABNORMAL HIGH (ref 0.7–4.0)
MCH: 27.6 pg (ref 26.0–34.0)
MCHC: 32 g/dL (ref 30.0–36.0)
MCV: 86.4 fL (ref 80.0–100.0)
Monocytes Absolute: 0.6 10*3/uL (ref 0.1–1.0)
Monocytes Relative: 6 %
Neutro Abs: 3.9 10*3/uL (ref 1.7–7.7)
Neutrophils Relative %: 39 %
Platelets: 257 10*3/uL (ref 150–400)
RBC: 4.56 MIL/uL (ref 3.87–5.11)
RDW: 13.2 % (ref 11.5–15.5)
WBC: 9.9 10*3/uL (ref 4.0–10.5)
nRBC: 0 % (ref 0.0–0.2)

## 2021-08-16 LAB — BASIC METABOLIC PANEL
Anion gap: 10 (ref 5–15)
BUN: 11 mg/dL (ref 6–20)
CO2: 23 mmol/L (ref 22–32)
Calcium: 9.3 mg/dL (ref 8.9–10.3)
Chloride: 107 mmol/L (ref 98–111)
Creatinine, Ser: 0.66 mg/dL (ref 0.44–1.00)
GFR, Estimated: >60 mL/min (ref >60)
Glucose, Bld: 138 mg/dL — ABNORMAL HIGH (ref 70–99)
Potassium: 3.5 mmol/L (ref 3.5–5.1)
Sodium: 140 mmol/L (ref 135–145)

## 2021-08-16 LAB — TROPONIN I (HIGH SENSITIVITY): Troponin I (High Sensitivity): <2 ng/L (ref <18)

## 2021-08-16 LAB — BRAIN NATRIURETIC PEPTIDE: B Natriuretic Peptide: 10 pg/mL (ref 0.0–100.0)

## 2021-08-16 LAB — D-DIMER, QUANTITATIVE: D-Dimer, Quant: 0.5 ug/mL-FEU (ref 0.00–0.50)

## 2021-08-16 MED ORDER — SODIUM CHLORIDE 0.9 % IV BOLUS
500.0000 mL | Freq: Once | INTRAVENOUS | Status: AC
  Administered 2021-08-16: 500 mL via INTRAVENOUS

## 2021-08-16 MED ORDER — IPRATROPIUM BROMIDE 0.02 % IN SOLN
RESPIRATORY_TRACT | Status: AC
  Administered 2021-08-16: 0.5 mg
  Filled 2021-08-16: qty 2.5

## 2021-08-16 MED ORDER — ALBUTEROL SULFATE HFA 108 (90 BASE) MCG/ACT IN AERS
2.0000 | INHALATION_SPRAY | RESPIRATORY_TRACT | Status: DC | PRN
  Administered 2021-08-16: 2 via RESPIRATORY_TRACT
  Filled 2021-08-16: qty 6.7

## 2021-08-16 MED ORDER — ALBUTEROL SULFATE (2.5 MG/3ML) 0.083% IN NEBU
INHALATION_SOLUTION | RESPIRATORY_TRACT | Status: AC
  Administered 2021-08-16: 12.5 mg
  Filled 2021-08-16: qty 15

## 2021-08-16 NOTE — ED Notes (Signed)
Patient give albuterol inhaler for home use. Patient started on continuous neb ( CAT ). 12 mg albuterol / atrovent 0.5mg  over 1 hour. Patient does not have any wheezing bilateral , decreased breath sounds. Does have hx of asthma. , appears to have increased work of breathing. Saturation and respiration normal . She states she feels as this is asthma. Attack.

## 2021-08-16 NOTE — ED Provider Notes (Signed)
[facility] Provider Note   CSN: [id number] Arrival date & time: 08/16/21  2224     History  Chief Complaint  Patient presents with   Shortness of Breath   Headache   Chest Pain    Lindsey Singleton is a 51 y.o. female.  Patient presents to the emergency department for evaluation of shortness of breath.  Symptoms began 2 days ago when she sat in a freshly painted her room.  She does have a history of asthma.  She has been using her inhaler but it is not helping.  She felt a little better yesterday and then the symptoms worsened today.  She has a tight squeezing pain in her chest that worsens when she breathes or coughs.      Home Medications Prior to Admission medications   Medication Sig Start Date End Date Taking? Authorizing Provider  predniSONE (DELTASONE) 20 MG tablet Take 2 tablets (40 mg total) by mouth daily with breakfast. 08/17/21  Yes Mykal Kirchman, Gwenyth Allegra, MD  acetaminophen (TYLENOL) 500 MG tablet Take 1,000 mg by mouth every 6 (six) hours as needed for moderate pain or headache.     [provider]  albuterol (PROVENTIL HFA;VENTOLIN HFA) 108 (90 Base) MCG/ACT inhaler Inhale 1-2 puffs into the lungs every 6 (six) hours as needed for wheezing or shortness of breath.    [provider]  cholecalciferol (VITAMIN D) 1000 units tablet Take 1,000 Units by mouth daily.     [provider]  dicyclomine (BENTYL) 20 MG tablet TAKE 1 TABLET BY MOUTH FOUR TIMES DAILY EVERY NIGHT BEFORE MEALS AND AT BEDTIME Patient taking differently: Take 20 mgs by mouth twice daily as needed for IBS 04/06/17   Alycia Rossetti, MD  FLUoxetine (PROZAC) 10 MG capsule TAKE 1 CAPSULE(10 MG) BY MOUTH DAILY Patient not taking: Reported on 05/14/2017 04/06/17   Alycia Rossetti, MD  fluticasone [facility]) 50 MCG/ACT nasal spray Place 1 spray into both nostrils daily as needed for allergies or rhinitis.    [provider]  ketoconazole (NIZORAL) 2 %  shampoo Apply 1 application topically 2 (two) times a week. Patient taking differently: Apply 1 application topically daily as needed for irritation.  10/30/16   Alycia Rossetti, MD  Menthol, Topical Analgesic, (ICY HOT EX) Apply 1 application topically daily as needed (for pain).     [provider]  pantoprazole (PROTONIX) 40 MG tablet TAKE 1 TABLET(40 MG) BY MOUTH DAILY BEFORE BREAKFAST Patient taking differently: Take 40 mgs by mouth twice daily as needed for heartburn 04/06/17   [place], Modena Nunnery, MD  traMADol (ULTRAM) 50 MG tablet Take 1 tablet (50 mg total) by mouth every 6 (six) hours as needed. Patient not taking: Reported on 09/21/2017 04/06/17   Alycia Rossetti, MD  UNABLE TO FIND Dispense BILATERAL WRIST SPLINTS.  CTS(carpal tunnel splint) wrist support Patient not taking: Reported on 09/21/2017 01/22/15   Dena Billet B, PA-C      Allergies    Pravastatin, Simvastatin, Cephalexin, Gabapentin, Lexapro [escitalopram oxalate], and Zetia [ezetimibe]    Review of Systems   Review of Systems  Respiratory:  Positive for cough, shortness of breath and wheezing.    Physical Exam Updated Vital Signs BP 96/63    Pulse 96    Temp 98 F (36.7 C)    Resp 18    Ht 5\' 2"  (1.575 m)    Wt 86.2 kg    SpO2 93%    BMI 34.75  kg/m  Physical Exam Vitals and nursing note reviewed.  Constitutional:      General: She is not in acute distress.    Appearance: She is well-developed.  HENT:     Head: Normocephalic and atraumatic.     Mouth/Throat:     Mouth: Mucous membranes are moist.  Eyes:     General: Vision grossly intact. Gaze aligned appropriately.     Extraocular Movements: Extraocular movements intact.     Conjunctiva/sclera: Conjunctivae normal.  Cardiovascular:     Rate and Rhythm: Normal rate and regular rhythm.     Pulses: Normal pulses.     Heart sounds: Normal heart sounds, S1 normal and S2 normal. No murmur heard.   No friction rub. No gallop.  Pulmonary:     Effort:  Pulmonary effort is normal. Tachypnea present. No respiratory distress.     Breath sounds: Decreased breath sounds present.  Abdominal:     General: Bowel sounds are normal.     Palpations: Abdomen is soft.     Tenderness: There is no abdominal tenderness. There is no guarding or rebound.     Hernia: No hernia is present.  Musculoskeletal:        General: No swelling.     Cervical back: Full passive range of motion without pain, normal range of motion and neck supple. No spinous process tenderness or muscular tenderness. Normal range of motion.     Right lower leg: No edema.     Left lower leg: No edema.  Skin:    General: Skin is warm and dry.     Capillary Refill: Capillary refill takes less than 2 seconds.     Findings: No ecchymosis, erythema, rash or wound.  Neurological:     General: No focal deficit present.     Mental Status: She is alert and oriented to person, place, and time.     GCS: GCS eye subscore is 4. GCS verbal subscore is 5. GCS motor subscore is 6.     Cranial Nerves: Cranial nerves 2-12 are intact.     Sensory: Sensation is intact.     Motor: Motor function is intact.     Coordination: Coordination is intact.  Psychiatric:        Attention and Perception: Attention normal.        Mood and Affect: Mood normal.        Speech: Speech normal.        Behavior: Behavior normal.    ED Results / Procedures / Treatments   Labs (all labs ordered are listed, but only abnormal results are displayed) Labs Reviewed  CBC WITH DIFFERENTIAL/PLATELET - Abnormal; Notable for the following components:      Result Value   Lymphs Abs 4.9 (*)    All other components within normal limits  BASIC METABOLIC PANEL - Abnormal; Notable for the following components:   Glucose, Bld 138 (*)    All other components within normal limits  RESP PANEL BY RT-PCR (FLU A&B, COVID) ARPGX2  BRAIN NATRIURETIC PEPTIDE  D-DIMER, QUANTITATIVE  TROPONIN I (HIGH SENSITIVITY)  TROPONIN I (HIGH  SENSITIVITY)    EKG EKG Interpretation  Date/Time:  Friday August 16 2021 22:45:29 EST Ventricular Rate:  62 PR Interval:  155 QRS Duration: 94 QT Interval:  411 QTC Calculation: 418 R Axis:   15 Text Interpretation: Sinus rhythm Consider left atrial enlargement Low voltage, precordial leads Confirmed by Orpah Greek [phone]) on 08/16/2021 11:10:42 PM  Radiology DG Chest [facility]  1 View  Result Date: 08/16/2021 CLINICAL DATA:  Cough and short of breath EXAM: PORTABLE CHEST 1 VIEW COMPARISON:  12/19/2015 FINDINGS: The heart size and mediastinal contours are within normal limits. Both lungs are clear. The visualized skeletal structures are unremarkable. IMPRESSION: No active disease. Electronically Signed   By: Donavan Foil M.D.   On: 08/16/2021 23:21    Procedures Procedures    Medications Ordered in ED Medications  albuterol (VENTOLIN HFA) 108 (90 Base) MCG/ACT inhaler 2 puff (2 puffs Inhalation Given 08/16/21 2252)  acetaminophen (TYLENOL) tablet 650 mg (has no administration in time range)  ibuprofen (ADVIL) tablet 800 mg (has no administration in time range)  ipratropium (ATROVENT) 0.02 % nebulizer solution (0.5 mg  Given 08/16/21 2253)  albuterol (PROVENTIL) (2.5 MG/3ML) 0.083% nebulizer solution (12.5 mg  Given 08/16/21 2253)  sodium chloride 0.9 % bolus 500 mL (0 mLs Intravenous Stopped 08/17/21 0050)    ED Course/ Medical Decision Making/ A&P                           Medical Decision Making Amount and/or Complexity of Data Reviewed Labs: ordered.  Risk Prescription drug management.   Patient presents to Emergency Department for evaluation of shortness of breath.  Patient has a history of asthma and her asthma symptoms increased when she was exposed to freshly painted room.  She has now developed some chest pain but it seems to be more of a pleuritic pain and related to cough.  This is likely inflammatory in nature.  Differential diagnosis would be asthma  exacerbation, pneumonia, PE, ACS.  Patient sinus rhythm on the monitor.  EKG without ischemic changes.  Troponin negative x2.  D-dimer negative.  Symptoms are most consistent with bronchospasm and pleurisy.  She was treated with continuous nebulizer treatment here in the department, has done well.  Work-up has been appropriate and reassuring.  Will discharge with continued bronchodilator therapy, steroids, follow-up as an outpatient.  Given return precautions.         Final Clinical Impression(s) / ED Diagnoses Final diagnoses:  Moderate persistent asthma with acute exacerbation    Rx / DC Orders ED Discharge Orders          Ordered    predniSONE (DELTASONE) 20 MG tablet  Daily with breakfast        08/17/21 0200              Orpah Greek, MD 08/17/21 0200

## 2021-08-16 NOTE — ED Notes (Signed)
Xray at bedside. Unsuccessful attempt at IV

## 2021-08-16 NOTE — ED Triage Notes (Signed)
Sob cough x 2 days after being in enclosed room that had been recently painted. Tachypnea and worsening shortness of breath today. Pt has used inhaler with little relief. Pt has hx of asthma. Pt co chest tightness currently rated 8/10 constant.

## 2021-08-17 LAB — RESP PANEL BY RT-PCR (FLU A&B, COVID) ARPGX2
Influenza A by PCR: NEGATIVE
Influenza B by PCR: NEGATIVE
SARS Coronavirus 2 by RT PCR: NEGATIVE

## 2021-08-17 LAB — TROPONIN I (HIGH SENSITIVITY): Troponin I (High Sensitivity): <2 ng/L (ref <18)

## 2021-08-17 MED ORDER — PREDNISONE 20 MG PO TABS
40.0000 mg | ORAL_TABLET | Freq: Every day | ORAL | 0 refills | Status: AC
Start: 2021-08-17 — End: ?

## 2021-08-17 MED ORDER — IBUPROFEN 800 MG PO TABS
800.0000 mg | ORAL_TABLET | Freq: Once | ORAL | Status: AC
  Administered 2021-08-17: 800 mg via ORAL
  Filled 2021-08-17: qty 1

## 2021-08-17 MED ORDER — ACETAMINOPHEN 325 MG PO TABS
650.0000 mg | ORAL_TABLET | Freq: Once | ORAL | Status: AC
  Administered 2021-08-17: 650 mg via ORAL
  Filled 2021-08-17: qty 2

## 2021-08-17 NOTE — ED Notes (Signed)
Patent states her breathing has improved, but she still has chest pain.

## 2021-09-23 ENCOUNTER — Other Ambulatory Visit: Payer: Self-pay | Admitting: *Deleted

## 2021-09-23 ENCOUNTER — Other Ambulatory Visit: Payer: Self-pay | Admitting: Obstetrics and Gynecology

## 2021-09-23 DIAGNOSIS — Z1231 Encounter for screening mammogram for malignant neoplasm of breast: Secondary | ICD-10-CM

## 2021-10-14 ENCOUNTER — Ambulatory Visit: Payer: Medicaid Other

## 2021-10-15 ENCOUNTER — Ambulatory Visit
Admission: RE | Admit: 2021-10-15 | Discharge: 2021-10-15 | Disposition: A | Discharge status: Home / Self Care | Payer: Medicaid Other | Source: Ambulatory Visit | Attending: Obstetrics and Gynecology | Admitting: Obstetrics and Gynecology

## 2021-10-15 DIAGNOSIS — Z1231 Encounter for screening mammogram for malignant neoplasm of breast: Secondary | ICD-10-CM

## 2022-01-15 ENCOUNTER — Other Ambulatory Visit: Payer: Self-pay | Admitting: Student in an Organized Health Care Education/Training Program

## 2022-01-15 DIAGNOSIS — R109 Unspecified abdominal pain: Secondary | ICD-10-CM

## 2022-01-23 ENCOUNTER — Ambulatory Visit
Admission: RE | Admit: 2022-01-23 | Discharge: 2022-01-23 | Disposition: A | Discharge status: Home / Self Care | Payer: Medicaid Other | Source: Ambulatory Visit | Attending: Obstetrics and Gynecology | Admitting: Obstetrics and Gynecology

## 2022-01-23 DIAGNOSIS — R109 Unspecified abdominal pain: Secondary | ICD-10-CM

## 2023-06-25 ENCOUNTER — Emergency Department (HOSPITAL_COMMUNITY)
Admission: EM | Admit: 2023-06-25 | Discharge: 2023-06-26 | Disposition: A | Discharge status: Home / Self Care | Payer: Medicare Other | Attending: Emergency Medicine | Admitting: Emergency Medicine

## 2023-06-25 ENCOUNTER — Encounter (HOSPITAL_COMMUNITY): Payer: Self-pay

## 2023-06-25 ENCOUNTER — Other Ambulatory Visit: Payer: Self-pay

## 2023-06-25 DIAGNOSIS — J45909 Unspecified asthma, uncomplicated: Secondary | ICD-10-CM | POA: Diagnosis not present

## 2023-06-25 DIAGNOSIS — N189 Chronic kidney disease, unspecified: Secondary | ICD-10-CM | POA: Insufficient documentation

## 2023-06-25 DIAGNOSIS — H9202 Otalgia, left ear: Secondary | ICD-10-CM | POA: Insufficient documentation

## 2023-06-25 MED ORDER — AMOXICILLIN 250 MG PO CAPS
500.0000 mg | ORAL_CAPSULE | Freq: Once | ORAL | Status: AC
Start: 2023-06-25 — End: 2023-06-25
  Administered 2023-06-25: 500 mg via ORAL
  Filled 2023-06-25: qty 2

## 2023-06-25 MED ORDER — HYDROCODONE-ACETAMINOPHEN 5-325 MG PO TABS
1.0000 | ORAL_TABLET | Freq: Once | ORAL | Status: AC
  Administered 2023-06-25: 1 via ORAL
  Filled 2023-06-25: qty 1

## 2023-06-25 MED ORDER — NAPROXEN 500 MG PO TABS: 500.0000 mg | ORAL_TABLET | Freq: Two times a day (BID) | ORAL | 0 refills | Status: AC

## 2023-06-25 MED ORDER — AMOXICILLIN 500 MG PO CAPS: 500.0000 mg | ORAL_CAPSULE | Freq: Three times a day (TID) | ORAL | 0 refills | Status: AC

## 2023-06-25 NOTE — Discharge Instructions (Signed)
You were evaluated in the Emergency Department and after careful evaluation, we did not find any emergent condition requiring admission or further testing in the hospital.  Your exam/testing today is overall reassuring.  Your symptoms may be due to an ear infection.  Take the amoxicillin antibiotic as directed.  Use the Naprosyn anti-inflammatory twice daily for pain.  Please return to the Emergency Department if you experience any worsening of your condition.   Thank you for allowing [place] to be a part of your care.

## 2023-06-25 NOTE — ED Triage Notes (Signed)
LEFT sided ear pain x4 days Feel like it is "going to pop" Pain in the back of the head Denies falls

## 2023-06-25 NOTE — ED Provider Notes (Signed)
[facility] Emergency Department Provider Note MRN:  [id number]  Arrival date & time: 06/25/23     Chief Complaint   Otalgia   History of Present Illness   Lindsey Singleton is a 52 y.o. year-old female with no pertinent past medical history presenting to the ED with chief complaint of otalgia.  Left ear pain for the past 4 days.  Sore throat that started 2 weeks ago and is much better.  Otherwise no symptoms.  Denies headache at this time, no chest pain or shortness of breath, no fever.  Review of Systems  A thorough review of systems was obtained and all systems are negative except as noted in the HPI and PMH.   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   Hyperlipidemia    no medication    Past Surgical History:  Procedure Laterality Date   APPENDECTOMY     carpal tunnel Right 11/2015   CESAREAN SECTION  1992, 2005   x 2   ESOPHAGOGASTRODUODENOSCOPY     OPEN REDUCTION MALAR FRACTURE Left    x 3 surgeries   TENNIS ELBOW RELEASE/NIRSCHEL PROCEDURE Right 09/28/2017   Procedure: RIGHT ELBOW LATERAL EPICONDYLAR DEBRIDEMENT AND REPAIR AS INDICATED;  Surgeon: Bradly Bienenstock, MD;  Location: [facility] OR;  Service: Orthopedics;  Laterality: Right;    Family History  Problem Relation Age of Onset   Heart disease Father    Diabetes Maternal Aunt    Diabetes Maternal Uncle    Stomach cancer Maternal Uncle    Leukemia Brother    Breast cancer Neg Hx     Social History   Socioeconomic History   Marital status: Married    Spouse name: Not on file   Number of children: 3   Years of education: Not on file   Highest education level: Not on file  Occupational History   Not on file  Tobacco Use   Smoking status: Never    Smokeless tobacco: Never  Vaping Use   Vaping status: Never Used  Substance and Sexual Activity   Alcohol use: No   Drug use: No   Sexual activity: Never    Birth control/protection: Condom  Other Topics Concern   Not on file  Social History Narrative   Patient is from [place], he is married 3 children   Social Drivers of Corporate investment banker Strain: Not on file  Food Insecurity: Not on file  Transportation Needs: Not on file  Physical Activity: Not on file  Stress: Not on file  Social Connections: Not on file  Intimate Partner Violence: Not on file     Physical Exam   Vitals:   06/25/23 2119  BP: 130/80  Pulse: 71  Resp: 18  Temp: 98.3 F (36.8 C)  SpO2: 98%    CONSTITUTIONAL: Well-appearing, NAD NEURO/PSYCH:  Alert and oriented x 3, no focal deficits EYES:  eyes equal and reactive ENT/NECK:  no LAD, no JVD CARDIO: Regular rate, well-perfused, normal S1 and S2 PULM:  CTAB no wheezing or rhonchi GI/GU:  non-distended, non-tender MSK/SPINE:  No gross deformities, no edema SKIN:  no rash, atraumatic   *Additional and/or pertinent findings included in MDM below  Diagnostic and Interventional Summary    EKG Interpretation Date/Time:    Ventricular Rate:    PR Interval:    QRS Duration:    QT Interval:    QTC Calculation:   R Axis:      Text Interpretation:         Labs Reviewed - No data to display  No orders to display    Medications  amoxicillin (AMOXIL) capsule 500 mg (has no administration in time range)  HYDROcodone-acetaminophen (NORCO/VICODIN) 5-325 MG per tablet 1 tablet (has no administration in time range)     Procedures  /  Critical Care Procedures  ED Course and Medical Decision Making  Initial Impression and Ddx The left TM seems to be bulging with white purulence suspicious for otitis media.  No mastoid tenderness, oropharynx appears normal, vital signs are normal.  Past medical/surgical history that increases complexity  of ED encounter: None  Interpretation of Diagnostics Laboratory and/or imaging options to aid in the diagnosis/care of the patient were considered.  After careful history and physical examination, it was determined that there was no indication for diagnostics at this time.  Patient Reassessment and Ultimate Disposition/Management     Discharge on antibiotics.  Patient management required discussion with the following services or consulting groups:  None  Complexity of Problems Addressed Acute complicated illness or Injury  Additional Data Reviewed and Analyzed Further history obtained from: Further history from spouse/family member  Additional Factors Impacting ED Encounter Risk Prescriptions  Elmer Sow. Pilar Plate, MD [facility] Emergency Medicine [facility] [email or web address]  Final Clinical Impressions(s) / ED Diagnoses     ICD-10-CM   1. Otalgia of left ear  H92.02       ED Discharge Orders          Ordered    amoxicillin (AMOXIL) 500 MG capsule  3 times daily        06/25/23 2348    naproxen (NAPROSYN) 500 MG tablet  2 times daily        06/25/23 2348             Discharge Instructions Discussed with and Provided to Patient:    Discharge Instructions      You were evaluated in the Emergency Department and after careful evaluation, we did not find any emergent condition requiring admission or further testing in the hospital.  Your exam/testing today is overall reassuring.  Your symptoms may be due to an ear infection.  Take the amoxicillin antibiotic as directed.  Use the Naprosyn anti-inflammatory twice daily for pain.  Please return to the Emergency Department if you experience any worsening of your condition.   Thank you for allowing [place] to be a part of your care.      Sabas Sous, MD 06/25/23 2350

## 2023-09-02 ENCOUNTER — Emergency Department (HOSPITAL_COMMUNITY)
Admission: EM | Admit: 2023-09-02 | Discharge: 2023-09-02 | Disposition: A | Discharge status: Home / Self Care | Payer: Medicare Other | Attending: Emergency Medicine | Admitting: Emergency Medicine

## 2023-09-02 ENCOUNTER — Encounter (HOSPITAL_COMMUNITY): Payer: Self-pay

## 2023-09-02 ENCOUNTER — Other Ambulatory Visit: Payer: Self-pay

## 2023-09-02 ENCOUNTER — Emergency Department (HOSPITAL_COMMUNITY): Payer: Medicare Other

## 2023-09-02 DIAGNOSIS — R519 Headache, unspecified: Secondary | ICD-10-CM | POA: Diagnosis not present

## 2023-09-02 DIAGNOSIS — R112 Nausea with vomiting, unspecified: Secondary | ICD-10-CM | POA: Insufficient documentation

## 2023-09-02 DIAGNOSIS — R1010 Upper abdominal pain, unspecified: Secondary | ICD-10-CM | POA: Insufficient documentation

## 2023-09-02 DIAGNOSIS — R55 Syncope and collapse: Secondary | ICD-10-CM | POA: Diagnosis not present

## 2023-09-02 DIAGNOSIS — K5900 Constipation, unspecified: Secondary | ICD-10-CM | POA: Diagnosis not present

## 2023-09-02 DIAGNOSIS — K219 Gastro-esophageal reflux disease without esophagitis: Secondary | ICD-10-CM

## 2023-09-02 DIAGNOSIS — R059 Cough, unspecified: Secondary | ICD-10-CM | POA: Insufficient documentation

## 2023-09-02 LAB — CBC WITH DIFFERENTIAL/PLATELET
Abs Immature Granulocytes: 0 10*3/uL (ref 0.00–0.07)
Band Neutrophils: 1 %
Basophils Absolute: 0.1 10*3/uL (ref 0.0–0.1)
Basophils Relative: 1 %
Eosinophils Absolute: 0.2 10*3/uL (ref 0.0–0.5)
Eosinophils Relative: 3 %
HCT: 42.6 % (ref 36.0–46.0)
Hemoglobin: 13.6 g/dL (ref 12.0–15.0)
Lymphocytes Relative: 47 %
Lymphs Abs: 2.9 10*3/uL (ref 0.7–4.0)
MCH: 27.4 pg (ref 26.0–34.0)
MCHC: 31.9 g/dL (ref 30.0–36.0)
MCV: 85.7 fL (ref 80.0–100.0)
Monocytes Absolute: 0.2 10*3/uL (ref 0.1–1.0)
Monocytes Relative: 4 %
Neutro Abs: 2.8 10*3/uL (ref 1.7–7.7)
Neutrophils Relative %: 44 %
Platelets: 287 10*3/uL (ref 150–400)
RBC: 4.97 MIL/uL (ref 3.87–5.11)
RDW: 14.4 % (ref 11.5–15.5)
WBC: 6.2 10*3/uL (ref 4.0–10.5)
nRBC: 0 % (ref 0.0–0.2)

## 2023-09-02 LAB — URINALYSIS, ROUTINE W REFLEX MICROSCOPIC
Bacteria, UA: NONE SEEN
Bilirubin Urine: NEGATIVE
Glucose, UA: NEGATIVE mg/dL
Hgb urine dipstick: NEGATIVE
Ketones, ur: NEGATIVE mg/dL
Nitrite: NEGATIVE
Protein, ur: NEGATIVE mg/dL
Specific Gravity, Urine: 1.026 (ref 1.005–1.030)
pH: 5 (ref 5.0–8.0)

## 2023-09-02 LAB — COMPREHENSIVE METABOLIC PANEL
ALT: 59 U/L — ABNORMAL HIGH (ref 0–44)
AST: 35 U/L (ref 15–41)
Albumin: 4 g/dL (ref 3.5–5.0)
Alkaline Phosphatase: 82 U/L (ref 38–126)
Anion gap: 10 (ref 5–15)
BUN: 11 mg/dL (ref 6–20)
CO2: 26 mmol/L (ref 22–32)
Calcium: 9.1 mg/dL (ref 8.9–10.3)
Chloride: 104 mmol/L (ref 98–111)
Creatinine, Ser: 0.59 mg/dL (ref 0.44–1.00)
GFR, Estimated: >60 mL/min (ref >60)
Glucose, Bld: 95 mg/dL (ref 70–99)
Potassium: 4.1 mmol/L (ref 3.5–5.1)
Sodium: 140 mmol/L (ref 135–145)
Total Bilirubin: 0.6 mg/dL (ref 0.0–1.2)
Total Protein: 7.3 g/dL (ref 6.5–8.1)

## 2023-09-02 LAB — LIPASE, BLOOD: Lipase: 28 U/L (ref 11–51)

## 2023-09-02 LAB — RESP PANEL BY RT-PCR (RSV, FLU A&B, COVID)  RVPGX2
Influenza A by PCR: NEGATIVE
Influenza B by PCR: NEGATIVE
Resp Syncytial Virus by PCR: NEGATIVE
SARS Coronavirus 2 by RT PCR: NEGATIVE

## 2023-09-02 MED ORDER — PANTOPRAZOLE SODIUM 40 MG PO TBEC: 40.0000 mg | DELAYED_RELEASE_TABLET | Freq: Every day | ORAL | 2 refills | Status: AC

## 2023-09-02 MED ORDER — ONDANSETRON 4 MG PO TBDP: 4.0000 mg | ORAL_TABLET | Freq: Three times a day (TID) | ORAL | 0 refills | Status: AC | PRN

## 2023-09-02 MED ORDER — ONDANSETRON HCL 4 MG/2ML IJ SOLN
4.0000 mg | Freq: Once | INTRAMUSCULAR | Status: AC
  Administered 2023-09-02: 4 mg via INTRAVENOUS
  Filled 2023-09-02: qty 2

## 2023-09-02 MED ORDER — PANTOPRAZOLE SODIUM 40 MG IV SOLR
40.0000 mg | Freq: Once | INTRAVENOUS | Status: AC
  Administered 2023-09-02: 40 mg via INTRAVENOUS
  Filled 2023-09-02: qty 10

## 2023-09-02 MED ORDER — IOHEXOL 300 MG/ML  SOLN
100.0000 mL | Freq: Once | INTRAMUSCULAR | Status: AC | PRN
  Administered 2023-09-02: 100 mL via INTRAVENOUS

## 2023-09-02 MED ORDER — SODIUM CHLORIDE 0.9 % IV BOLUS
1000.0000 mL | Freq: Once | INTRAVENOUS | Status: AC
  Administered 2023-09-02: 1000 mL via INTRAVENOUS

## 2023-09-02 NOTE — Discharge Instructions (Addendum)
 You were seen in the emergency department for a fainting spell along with upper abdominal pain and poor intake.  You had lab work and a CAT scan that did not show an obvious explanation for your symptoms.  If you are not already taking it please start pantoprazole (Protonix) daily.  Schedule an appointment with our GI specialist Dr. Jena Gauss.  Also follow-up with your primary care doctor.  Return to the emergency department if any worsening or concerning symptoms.  Your COVID and flu test were pending at time of discharge and you can follow-up your results on MyChart.

## 2023-09-02 NOTE — ED Provider Notes (Signed)
 [place] EMERGENCY DEPARTMENT AT [facility] Provider Note   CSN: [id number] Arrival date & time: 09/02/23  1638     History {Add pertinent medical, surgical, social history, OB history to HPI:1} Chief Complaint  Patient presents with   Loss of Consciousness    Lindsey Singleton is a 53 y.o. female.  She is here for evaluation of nausea vomiting upper abdominal pain and headache that is been going on for few weeks.  Has not been eating or drinking very much.  About a week ago she had a syncopal event while at the airport.  EMS was called and evaluated her, found her blood pressure to be low but she declined transport as she was traveling and needed to catch her flight.  She called her doctor and has an appointment tomorrow but they told her she should come here for further evaluation.  She does not think she has had a fever.  Nausea but no vomiting.  Initially had some diarrhea but now feels more constipated.  Prior surgical history of cesarean sections and appendectomy but still has her gallbladder.  She is primarily Spanish-speaking but asked that her son translates for her.  The history is provided by the patient.  Abdominal Pain Pain location:  Epigastric Pain quality: aching   Pain severity:  Moderate Onset quality:  Gradual Relieved by:  None tried Associated symptoms: constipation, cough, diarrhea and nausea   Associated symptoms: no dysuria, no fever, no hematuria and no vomiting        Home Medications Prior to Admission medications   Medication Sig Start Date End Date Taking? Authorizing Provider  acetaminophen (TYLENOL) 500 MG tablet Take 1,000 mg by mouth every 6 (six) hours as needed for moderate pain or headache.     [provider]  albuterol (PROVENTIL HFA;VENTOLIN HFA) 108 (90 Base) MCG/ACT inhaler Inhale 1-2 puffs into the lungs every 6 (six) hours as needed for wheezing or shortness of breath.    [provider]  cholecalciferol (VITAMIN  D) 1000 units tablet Take 1,000 Units by mouth daily.     [provider]  dicyclomine (BENTYL) 20 MG tablet TAKE 1 TABLET BY MOUTH FOUR TIMES DAILY EVERY NIGHT BEFORE MEALS AND AT BEDTIME Patient taking differently: Take 20 mgs by mouth twice daily as needed for IBS 04/06/17   Salley Scarlet, MD  FLUoxetine (PROZAC) 10 MG capsule TAKE 1 CAPSULE(10 MG) BY MOUTH DAILY Patient not taking: Reported on 05/14/2017 04/06/17   Salley Scarlet, MD  fluticasone [facility]) 50 MCG/ACT nasal spray Place 1 spray into both nostrils daily as needed for allergies or rhinitis.    [provider]  ketoconazole (NIZORAL) 2 % shampoo Apply 1 application topically 2 (two) times a week. Patient taking differently: Apply 1 application topically daily as needed for irritation.  10/30/16   Salley Scarlet, MD  Menthol, Topical Analgesic, (ICY HOT EX) Apply 1 application topically daily as needed (for pain).     [provider]  naproxen (NAPROSYN) 500 MG tablet Take 1 tablet (500 mg total) by mouth 2 (two) times daily. 06/25/23   Sabas Sous, MD  pantoprazole (PROTONIX) 40 MG tablet TAKE 1 TABLET(40 MG) BY MOUTH DAILY BEFORE BREAKFAST Patient taking differently: Take 40 mgs by mouth twice daily as needed for heartburn 04/06/17   Salley Scarlet, MD  predniSONE (DELTASONE) 20 MG tablet Take 2 tablets (40 mg total) by mouth daily with breakfast. 08/17/21   [place], Canary Brim,  MD  traMADol (ULTRAM) 50 MG tablet Take 1 tablet (50 mg total) by mouth every 6 (six) hours as needed. Patient not taking: Reported on 09/21/2017 04/06/17   Salley Scarlet, MD  UNABLE TO FIND Dispense BILATERAL WRIST SPLINTS.  CTS(carpal tunnel splint) wrist support Patient not taking: Reported on 09/21/2017 01/22/15   Allayne Butcher B, PA-C      Allergies    Pravastatin, Simvastatin, Cephalexin, Gabapentin, Lexapro [escitalopram oxalate], and Zetia [ezetimibe]    Review of Systems   Review of Systems   Constitutional:  Negative for fever.  Respiratory:  Positive for cough.   Gastrointestinal:  Positive for abdominal pain, constipation, diarrhea and nausea. Negative for vomiting.  Genitourinary:  Negative for dysuria and hematuria.    Physical Exam Updated Vital Signs BP 113/70   Pulse (!) 58   Temp 98.7 F (37.1 C) (Oral)   Resp 18   Ht 5\' 1"  (1.549 m)   Wt 86 kg   LMP 09/20/2017   SpO2 97%   BMI 35.82 kg/m  Physical Exam Vitals and nursing note reviewed.  Constitutional:      General: She is not in acute distress.    Appearance: Normal appearance. She is well-developed.  HENT:     Head: Normocephalic and atraumatic.  Eyes:     Conjunctiva/sclera: Conjunctivae normal.  Cardiovascular:     Rate and Rhythm: Normal rate and regular rhythm.     Heart sounds: No murmur heard. Pulmonary:     Effort: Pulmonary effort is normal. No respiratory distress.     Breath sounds: Normal breath sounds.  Abdominal:     Palpations: Abdomen is soft.     Tenderness: There is abdominal tenderness (upper abd).  Musculoskeletal:        General: No deformity.     Cervical back: Neck supple.  Skin:    General: Skin is warm and dry.     Capillary Refill: Capillary refill takes less than 2 seconds.  Neurological:     General: No focal deficit present.     Mental Status: She is alert.     Sensory: No sensory deficit.     Motor: No weakness.     ED Results / Procedures / Treatments   Labs (all labs ordered are listed, but only abnormal results are displayed) Labs Reviewed  COMPREHENSIVE METABOLIC PANEL - Abnormal; Notable for the following components:      Result Value   ALT 59 (*)    All other components within normal limits  URINALYSIS, ROUTINE W REFLEX MICROSCOPIC - Abnormal; Notable for the following components:   Leukocytes,Ua TRACE (*)    All other components within normal limits  RESP PANEL BY RT-PCR (RSV, FLU A&B, COVID)  RVPGX2  CBC WITH DIFFERENTIAL/PLATELET  LIPASE,  BLOOD    EKG None  Radiology No results found.  Procedures Procedures  {Document cardiac monitor, telemetry assessment procedure when appropriate:1}  Medications Ordered in ED Medications  sodium chloride 0.9 % bolus 1,000 mL (has no administration in time range)  ondansetron (ZOFRAN) injection 4 mg (has no administration in time range)    ED Course/ Medical Decision Making/ A&P   {   Click here for ABCD2, HEART and other calculatorsREFRESH Note before signing :1}                              Medical Decision Making Amount and/or Complexity of Data Reviewed Labs: ordered. Radiology: ordered.  Risk Prescription drug management.   This patient complains of ***; this involves an extensive number of treatment Options and is a complaint that carries with it a high risk of complications and morbidity. The differential includes ***  I ordered, reviewed and interpreted labs, which included *** I ordered medication *** and reviewed PMP when indicated. I ordered imaging studies which included *** and I independently    visualized and interpreted imaging which showed *** Additional history obtained from *** Previous records obtained and reviewed *** I consulted *** and discussed lab and imaging findings and discussed disposition.  Cardiac monitoring reviewed, *** Social determinants considered, *** Critical Interventions: ***  After the interventions stated above, I reevaluated the patient and found *** Admission and further testing considered, ***   {Document critical care time when appropriate:1} {Document review of labs and clinical decision tools ie heart score, Chads2Vasc2 etc:1}  {Document your independent review of radiology images, and any outside records:1} {Document your discussion with family members, caretakers, and with consultants:1} {Document social determinants of health affecting pt's care:1} {Document your decision making why or why not admission,  treatments were needed:1} Final Clinical Impression(s) / ED Diagnoses Final diagnoses:  None    Rx / DC Orders ED Discharge Orders     None

## 2023-09-02 NOTE — ED Notes (Signed)
 Pt says she is feeling a little nauseated, has not vomited since yesterday, pt says she hasn't ate or drank anything today besides a couple of sips of water.

## 2023-09-02 NOTE — ED Notes (Signed)
 Patient transported to CT

## 2023-09-02 NOTE — ED Triage Notes (Signed)
 Pt arrived via POV c/o syncopal episode 8 days ago, and reports feeling as though she is constantly going to pass out. Pt also endorses generalized abdominal pain. Pt endorses nausea.

## 2023-09-03 ENCOUNTER — Emergency Department (HOSPITAL_COMMUNITY)
Admission: EM | Admit: 2023-09-03 | Discharge: 2023-09-03 | Disposition: A | Discharge status: Home / Self Care | Payer: Medicare Other | Attending: Emergency Medicine | Admitting: Emergency Medicine

## 2023-09-03 DIAGNOSIS — J45909 Unspecified asthma, uncomplicated: Secondary | ICD-10-CM | POA: Diagnosis not present

## 2023-09-03 DIAGNOSIS — L299 Pruritus, unspecified: Secondary | ICD-10-CM | POA: Insufficient documentation

## 2023-09-03 DIAGNOSIS — N189 Chronic kidney disease, unspecified: Secondary | ICD-10-CM | POA: Insufficient documentation

## 2023-09-03 MED ORDER — FAMOTIDINE 20 MG PO TABS
20.0000 mg | ORAL_TABLET | Freq: Once | ORAL | Status: AC
Start: 2023-09-03 — End: 2023-09-03
  Administered 2023-09-03: 20 mg via ORAL
  Filled 2023-09-03: qty 1

## 2023-09-03 MED ORDER — DIPHENHYDRAMINE HCL 25 MG PO CAPS
25.0000 mg | ORAL_CAPSULE | Freq: Once | ORAL | Status: AC
  Administered 2023-09-03: 25 mg via ORAL
  Filled 2023-09-03: qty 1

## 2023-09-03 MED ORDER — NYSTATIN 100000 UNIT/GM EX POWD: 1.0000 | Freq: Four times a day (QID) | CUTANEOUS | 0 refills | Status: AC

## 2023-09-03 MED ORDER — FAMOTIDINE 20 MG PO TABS: 20.0000 mg | ORAL_TABLET | Freq: Two times a day (BID) | ORAL | 0 refills | Status: AC

## 2023-09-03 MED ORDER — DEXAMETHASONE SODIUM PHOSPHATE 10 MG/ML IJ SOLN
10.0000 mg | Freq: Once | INTRAMUSCULAR | Status: AC
  Administered 2023-09-03: 10 mg via INTRAMUSCULAR
  Filled 2023-09-03: qty 1

## 2023-09-03 MED ORDER — DIPHENHYDRAMINE HCL 25 MG PO TABS: 25.0000 mg | ORAL_TABLET | Freq: Four times a day (QID) | ORAL | 0 refills | Status: AC

## 2023-09-03 NOTE — ED Triage Notes (Addendum)
 Pt states that she had an allergic reaction to something. endorses feeling itchy all over. Seen here yesterday and states that after she left she experienced swelling once she got home. Pt also endorses feeling itchy since receiving an IV medication yesterday. Pt took benadryl around 6pm with some relief.

## 2023-09-03 NOTE — ED Provider Notes (Signed)
 [facility]-EMERGENCY DEPT [facility] Emergency Department Provider Note MRN:  [id number]  Arrival date & time: 09/03/23     Chief Complaint   Allergic Reaction   History of Present Illness   Lindsey Singleton is a 53 y.o. year-old female with a history of GERD presenting to the ED with chief complaint of allergic reaction.  Patient concerned she is having allergic reaction to something.  Was here last night and received IV medications and response to some abdominal discomfort.  All day today having itching to the face, torso, arms, legs.  No rash to her whole body but does have rash beneath her breasts for the past few days.  Review of Systems  A thorough review of systems was obtained and all systems are negative except as noted in the HPI and PMH.   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   Hyperlipidemia    no medication    Past Surgical History:  Procedure Laterality Date   APPENDECTOMY     carpal tunnel Right 11/2015   CESAREAN SECTION  1992, 2005   x 2   ESOPHAGOGASTRODUODENOSCOPY     OPEN REDUCTION MALAR FRACTURE Left    x 3 surgeries   TENNIS ELBOW RELEASE/NIRSCHEL PROCEDURE Right 09/28/2017   Procedure: RIGHT ELBOW LATERAL EPICONDYLAR DEBRIDEMENT AND REPAIR AS INDICATED;  Surgeon: Bradly Bienenstock, MD;  Location: [facility] OR;  Service: Orthopedics;  Laterality: Right;    Family History  Problem Relation Age of Onset   Heart disease Father    Diabetes Maternal Aunt    Diabetes Maternal Uncle    Stomach cancer Maternal Uncle    Leukemia Brother    Breast cancer Neg Hx     Social History   Socioeconomic History   Marital status: Married    Spouse name: Not on file   Number of children: 3   Years of education: Not on file    Highest education level: Not on file  Occupational History   Not on file  Tobacco Use   Smoking status: Never   Smokeless tobacco: Never  Vaping Use   Vaping status: Never Used  Substance and Sexual Activity   Alcohol use: No   Drug use: No   Sexual activity: Never    Birth control/protection: Condom  Other Topics Concern   Not on file  Social History Narrative   Patient is from [place], he is married 3 children   Social Drivers of Corporate investment banker Strain: Not on file  Food Insecurity: Not on file  Transportation Needs: Not on file  Physical Activity: Not on file  Stress: Not on file  Social Connections: Not on file  Intimate Partner Violence: Not on file     Physical Exam   Vitals:   09/03/23 2243 09/03/23 2315  BP:  100/67  Pulse:  68  Resp:  16  Temp:    SpO2: 97% 94%    CONSTITUTIONAL: Well-appearing, NAD NEURO/PSYCH:  Alert and oriented x 3, no focal deficits EYES:  eyes equal and reactive ENT/NECK:  no LAD, no JVD CARDIO: Regular rate, well-perfused, normal S1 and S2 PULM:  CTAB no wheezing or rhonchi GI/GU:  non-distended, non-tender MSK/SPINE:  No gross deformities, no edema SKIN:  no rash, atraumatic   *Additional and/or pertinent findings included in MDM below  Diagnostic and Interventional Summary    EKG Interpretation Date/Time:    Ventricular Rate:    PR Interval:    QRS Duration:    QT Interval:    QTC Calculation:   R Axis:      Text Interpretation:         Labs Reviewed - No data to display  No orders to display    Medications  dexamethasone (DECADRON) injection 10 mg (10 mg Intramuscular Given 09/03/23 2340)  diphenhydrAMINE (BENADRYL) capsule 25 mg (25 mg Oral Given 09/03/23 2339)  famotidine (PEPCID) tablet 20 mg (20 mg Oral Given 09/03/23 2339)     Procedures  /  Critical Care Procedures  ED Course and Medical Decision Making  Initial Impression and Ddx No obvious rash but having diffuse itching, possibly  mild allergic response to recent medications, no new medications at home or new exposures.  No signs or symptoms of anaphylaxis or angioedema.  Her LFTs yesterday were normal, doubt hepatic cause of itching.  Past medical/surgical history that increases complexity of ED encounter: None  Interpretation of Diagnostics Laboratory and/or imaging options to aid in the diagnosis/care of the patient were considered.  After careful history and physical examination, it was determined that there was no indication for diagnostics at this time.  Patient Reassessment and Ultimate Disposition/Management     Discharge  Patient management required discussion with the following services or consulting groups:  None  Complexity of Problems Addressed Acute complicated illness or Injury  Additional Data Reviewed and Analyzed Further history obtained from: Further history from spouse/family member  Additional Factors Impacting ED Encounter Risk Prescriptions  Elmer Sow. Pilar Plate, MD [facility] Emergency Medicine [facility] [email or web address]  Final Clinical Impressions(s) / ED Diagnoses     ICD-10-CM   1. Itchy skin  L29.9       ED Discharge Orders          Ordered    diphenhydrAMINE (BENADRYL) 25 MG tablet  Every 6 hours        09/03/23 2348    famotidine (PEPCID) 20 MG tablet  2 times daily        09/03/23 2348    nystatin (MYCOSTATIN/NYSTOP) powder  4 times daily        09/03/23 2348             Discharge Instructions Discussed with and Provided to Patient:    Discharge Instructions      You were evaluated in the Emergency Department and after careful evaluation, we did not find any emergent condition requiring admission or further testing in the hospital.  Your exam/testing today is overall reassuring.  Symptoms may be due to an allergic reaction.  Use the Benadryl every 4 hours as needed for itching.  Can also use the Pepcid every 12 hours as needed for  itching.  For the rash beneath your breasts, recommend using the nystatin powder as directed until the rash goes away.  Please return to the Emergency Department if you experience any worsening of your condition.   Thank you for allowing [place] to be a part of your care.      Sabas Sous, MD 09/03/23 2350

## 2023-09-03 NOTE — Discharge Instructions (Signed)
 You were evaluated in the Emergency Department and after careful evaluation, we did not find any emergent condition requiring admission or further testing in the hospital.  Your exam/testing today is overall reassuring.  Symptoms may be due to an allergic reaction.  Use the Benadryl every 4 hours as needed for itching.  Can also use the Pepcid every 12 hours as needed for itching.  For the rash beneath your breasts, recommend using the nystatin powder as directed until the rash goes away.  Please return to the Emergency Department if you experience any worsening of your condition.   Thank you for allowing [place] to be a part of your care.
